# Patient Record
Sex: Female | Born: 1985 | Race: White | Hispanic: No | Marital: Married | Smoking: Never smoker
Health system: Southern US, Community
[De-identification: ages and names within clinical notes are randomized; demographics above are authoritative.]

## PROBLEM LIST (undated history)

## (undated) DIAGNOSIS — D649 Anemia, unspecified: Secondary | ICD-10-CM

## (undated) DIAGNOSIS — E785 Hyperlipidemia, unspecified: Secondary | ICD-10-CM

## (undated) DIAGNOSIS — T7840XA Allergy, unspecified, initial encounter: Secondary | ICD-10-CM

## (undated) DIAGNOSIS — K603 Anal fistula, unspecified: Secondary | ICD-10-CM

## (undated) HISTORY — PX: NO PAST SURGERIES: SHX2092

## (undated) HISTORY — DX: Hyperlipidemia, unspecified: E78.5

## (undated) HISTORY — DX: Anal fistula, unspecified: K60.30

## (undated) HISTORY — DX: Allergy, unspecified, initial encounter: T78.40XA

---
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
## 2005-08-04 ENCOUNTER — Other Ambulatory Visit: Admission: RE | Admit: 2005-08-04 | Discharge: 2005-08-04 | Payer: Self-pay | Admitting: Family Medicine

## 2006-12-03 ENCOUNTER — Emergency Department: Payer: Self-pay | Admitting: Emergency Medicine

## 2008-03-03 ENCOUNTER — Inpatient Hospital Stay (HOSPITAL_COMMUNITY): Admission: AD | Admit: 2008-03-03 | Discharge: 2008-03-05 | Payer: Self-pay | Admitting: Obstetrics and Gynecology

## 2010-05-07 ENCOUNTER — Inpatient Hospital Stay (HOSPITAL_COMMUNITY): Admission: AD | Admit: 2010-05-07 | Payer: Self-pay | Admitting: Obstetrics and Gynecology

## 2010-07-11 ENCOUNTER — Ambulatory Visit: Payer: Self-pay | Admitting: Dietician

## 2010-08-10 ENCOUNTER — Inpatient Hospital Stay (HOSPITAL_COMMUNITY)
Admission: AD | Admit: 2010-08-10 | Discharge: 2010-08-12 | DRG: 775 | Disposition: A | Discharge status: Home / Self Care | Payer: 59 | Source: Ambulatory Visit | Attending: Obstetrics and Gynecology | Admitting: Obstetrics and Gynecology

## 2010-08-10 DIAGNOSIS — O99892 Other specified diseases and conditions complicating childbirth: Secondary | ICD-10-CM | POA: Diagnosis present

## 2010-08-10 DIAGNOSIS — D649 Anemia, unspecified: Secondary | ICD-10-CM | POA: Diagnosis not present

## 2010-08-10 DIAGNOSIS — O9903 Anemia complicating the puerperium: Secondary | ICD-10-CM | POA: Diagnosis not present

## 2010-08-10 DIAGNOSIS — Z22330 Carrier of Group B streptococcus: Secondary | ICD-10-CM

## 2010-08-11 LAB — CBC
HCT: 32.8 % — ABNORMAL LOW (ref 36.0–46.0)
Hemoglobin: 10.9 g/dL — ABNORMAL LOW (ref 12.0–15.0)
MCH: 28.8 pg (ref 26.0–34.0)
MCHC: 33.2 g/dL (ref 30.0–36.0)
MCV: 86.8 fL (ref 78.0–100.0)
Platelets: 152 10*3/uL (ref 150–400)
RDW: 12.7 % (ref 11.5–15.5)
WBC: 14.8 10*3/uL — ABNORMAL HIGH (ref 4.0–10.5)

## 2010-08-11 LAB — RPR: RPR Ser Ql: NONREACTIVE

## 2010-08-12 LAB — CBC
HCT: 30.1 % — ABNORMAL LOW (ref 36.0–46.0)
Hemoglobin: 9.9 g/dL — ABNORMAL LOW (ref 12.0–15.0)
MCH: 28.7 pg (ref 26.0–34.0)
MCHC: 32.9 g/dL (ref 30.0–36.0)
MCV: 87.2 fL (ref 78.0–100.0)
Platelets: 170 K/uL (ref 150–400)
RBC: 3.45 MIL/uL — ABNORMAL LOW (ref 3.87–5.11)
RDW: 12.7 % (ref 11.5–15.5)
WBC: 14.3 K/uL — ABNORMAL HIGH (ref 4.0–10.5)

## 2010-08-19 NOTE — H&P (Signed)
NAMEVELENCIA, LENART NO.:  [id number]   MEDICAL RECORD NO.:  [id number]          PATIENT TYPE:  MAT   LOCATION:  MATC                          FACILITY:  [facility]   PHYSICIAN:  Crist Fat. Rivard, M.D. DATE OF BIRTH:  1986/02/24   DATE OF ADMISSION:  03/03/2008  DATE OF DISCHARGE:                              HISTORY & PHYSICAL   Ms. Ertel is a 25 year old married white female gravida 2, para 1-0-0-1  at 50 weeks' gestation per an [facility] of March 10, 2008 who presents in  early labor.  She reports irregular contractions since waking this  morning which are now 1-3 minutes since around 12:30 this afternoon.  She reports good fetal movement.  No vaginal bleeding or leakage of  fluid, no PIH or UTI signs or symptoms, fever, cough or shortness of  breath.  She has been followed by the CNM service at [facility].  History  remarkable for:  1. Questionable LMP and had a 9-4/7 weeks' ultrasound that was      consistent with her LMP date.  2. Group beta strep positive.   OBSTETRICAL HISTORY:  Gravida 1 spontaneous vaginal delivery of a female  in March 2005 at 40 weeks' gestation weighing 7 pounds 14 ounces after  21 hours of labor.  She did have an epidural.  No complications.  Delivered by Larene Beach and his name is Aidan.  Gravida 2 is current  pregnancy.   PRENATAL LABS:  Blood type is A+, Rh antibody screen negative, RPR  nonreactive, rubella titer immune, hepatitis surface antigen negative,  HIV nonreactive.  Cystic fibrosis negative.  Hemoglobin Aug 10, 2007 was  12.7, hematocrit 37.5 and platelets were 236.   PAST MEDICAL HISTORY:   ALLERGIES:  SHE DENIES MEDICATION OR LATEX ALLERGIES.  SHE DOES REPORT  SEASONAL ALLERGIES.   MENSTRUAL HISTORY:  She reports menarche at age 85, monthly cycles.  No  abnormalities.  Did not have a normal last menstrual period but first  day was June 04, 2007, which gave her an [facility] of March 10, 2008 and  like I noted, she did have an  ultrasound for dating and it was  consistent within 3 days of her EDC by the LMP.  She has used oral  contraceptive pills in the past for birth control.  She discontinued her  Yasmin in January of 2009.  She was GBS positive with her first child as  well as this pregnancy.  She did have varicella as a child.  Denies any  abnormal Pap smears or history of STDs.  The patient did not have any  other significant medical problems.  She did have her wisdom teeth  extracted in 2006 and the anesthesia did make her loopy.  Genetic  history remarkable for a maternal aunt with cerebral palsy.   FAMILY HISTORY:  Remarkable for maternal grandmother with chronic  hypertension and on medication.  Paternal uncle and wife, drugs and  alcohol.   SOCIAL HISTORY:  The patient is a married white female.  The name of the  baby's father is Alya Smaltz.  He is involved and supportive.  The  patient works full time for [employer].  Has a high Education officer, community.  The  father of the baby works full time as a Facilities manager, has a bachelor's  degree.  She denied alcohol, tobacco or illicit drug use.   HISTORY OF PRESENT PREGNANCY:  She entered care, I think, July 07, 2007  for her new OB interview.  She then returned Aug 10, 2007 for her new OB  workup.  Her height is 5 feet 5-1/2 inches.  Her pre gravid weight was  140.  She was 140 at that first visit and was 9-4/7 weeks.  Had a crown-  rump length that was measuring 9-1/7 weeks.  Planned CNM care.  She had  a Pap done in May of 2008.  She had first trimester screen done Aug 25, 2007.  Had a left corpus luteal cyst measuring 6 cm and first trimester  screen was within normal limits.  She has had a Pap smear done October 10, 2007.  Had ultrasound at 18-2/7 weeks.  Growth was consistent with her  previous dating.  Cervical length 4.21 cm, posterior placenta.  Adjusted  risk was 1 in 34,770.  She declined AFP that day.  At around 22 weeks  she was complaining of cold, was  taking cough syrup.  History of  allergies.  Drainage was clear.  No fever.  Over-the-counter comfort  measures were discussed.  She had her 1-hour GTT around 26 weeks.  It  was high, equal to 149, and then did have a subsequent 3-hour GTT.  She  got [facility] at [facility] in September.  Hemoglobin was 12 at that  time.  The patient's 3-hour GTT results were all within normal limits.  Due to receive H1N1 vaccine January 31, 2008.  GC, chlamydia and GBS  cultures were done February 17, 2008 and were negative, positive GBS,  negative GC and chlamydia cultures.  The patient's weight on February 21, 2008 was  180-1/2.  She has been measuring size equal to dates with  fundal height, vertex presentation and without other common pregnancy  discomforts.  The patient's pregnancy continued to progress without any  significant complications until her presentation today in early labor.   OBJECTIVE:  VITAL SIGNS:  On admission 130/78 blood pressure, heart rate  was slightly tachycardiac at 123.  Her temperature was 98.4 and her  respirations were 18.  Fetal heart rate baseline of 135, reactive, no  decels, moderate variability noted.  Toco showing uterine contractions  every 1-3 minutes, moderate on palpation.   PHYSICAL EXAM:  GENERAL:  No acute distress, alert and oriented x3, and  pleasant.  HEENT:  Grossly intact, within normal limits.  CARDIOVASCULAR:  Regular rate and rhythm without murmur.  LUNGS:  Clear to auscultation bilaterally.  ABDOMEN:  Soft, nontender and gravid.  PELVIC EXAM:  The patient 4+ centimeters, 70% effaced, -2, bulging bag  of water.  She did have some leakage of some fluid.  It was negative  Nitrazine and negative fern.  EXTREMITIES:  With mild generalized edema, no clonus.  DTRs 2+.   IMPRESSION:  1. Intrauterine pregnancy at 39 weeks.  2. Early labor.  3. Group beta strep positive.   PLAN:  1. Admit to birthing suites with Dr. Estanislado Pandy as the attending       physician.  2. Routine L and D orders.  3. Penicillin G IV per GBS protocol.  4. Patient undecided  regarding epidural at present.  5. Plan AROM after first dose of penicillin.  6. Consult with MD p.r.n.      Candice [place], [place]      ______________________________  Crist Fat Rivard, M.D.    CHS/MEDQ  D:  03/03/2008  T:  03/03/2008  Job:  [id number]

## 2010-08-22 NOTE — H&P (Signed)
[name], [name] NO.:  [id number]   MEDICAL RECORD NO.:  [id number]                   PATIENT TYPE:  INP   LOCATION:  9163                                 [facility]ACILITY:  [facility]   PHYSICIAN:  Crist Fat. Rivard, M.D.              DATE OF BIRTH:  May 25, 1985   DATE OF ADMISSION:  06/09/2003  DATE OF DISCHARGE:                                HISTORY & PHYSICAL   HISTORY OF PRESENT ILLNESS:  This is a 25 year old gravida 1 para 0 at 80  and one-seventh weeks who presents with complaints of regular uterine  contractions which are becoming more painful.  She denies leaking or  bleeding and reports positive fetal movement.  Pregnancy has been followed  by the nurse midwife service and remarkable for:  1. Teen.  2. Group B strep positive.   OBSTETRICAL HISTORY:  The patient is a primigravida.   MEDICAL HISTORY:  Remarkable for childhood varicella and high cholesterol.   FAMILY HISTORY:  Remarkable for varicosities in her grandmother and her  grandmother also has unknown type of thyroid dysfunction and kidney stones.  First cousin has kidney stones.   GENETIC HISTORY:  Remarkable for maternal aunt with cerebral palsy and the  father of the baby is adopted.   SOCIAL HISTORY:  The patient is single but involved with Monique [name].  She  is engaged to him.  She is of the [employer].  She is currently a  Consulting civil engineer.  She denies any alcohol, tobacco, or drug use.   PRENATAL LABORATORY DATA:  Hemoglobin 12.5, platelets 219.  Blood type A  positive, antibody screen negative.  RPR nonreactive.  Rubella immune.  Hepatitis negative.  HIV negative.  Pap test normal.  Gonorrhea and  chlamydia not available.  Cystic fibrosis negative.  Quad screen normal.  Group B strep positive.   OBJECTIVE DATA:  VITAL SIGNS:  Stable, afebrile.  HEENT:  Within normal limits.  Thyroid not enlarged.  CHEST:  Clear to auscultation.  HEART:  Regular rate and rhythm.  ABDOMEN:  Gravid at  38 cm, vertex to Leopold's.  EFM shows a reactive fetal  heart rate with uterine contractions every 2-3 minutes.  PELVIC:  Cervical exam initially 2 cm, 85%, -1 to -2 station, and vertex.  Then, 1 hour later she was 3 cm dilated.  EXTREMITIES:  Within normal limits.   ASSESSMENT:  1. Intrauterine pregnancy at 39 and one-seventh weeks.  2. Early labor.  3. Group B strep positive.   PLAN:  1. Admit to birthing suites.  2. Routine C.N.M. orders.  3. Group B strep prophylaxis when active.     Monique [name], C.N.M.                 Crist Fat Rivard, M.D.    MLW/MEDQ  D:  06/09/2003  T:  06/09/2003  Job:  [id number]

## 2011-01-06 LAB — CBC
HCT: 31.6 % — ABNORMAL LOW (ref 36.0–46.0)
HCT: 34.6 % — ABNORMAL LOW (ref 36.0–46.0)
Hemoglobin: 10.9 g/dL — ABNORMAL LOW (ref 12.0–15.0)
Hemoglobin: 11.9 g/dL — ABNORMAL LOW (ref 12.0–15.0)
MCHC: 34.4 g/dL (ref 30.0–36.0)
MCHC: 34.4 g/dL (ref 30.0–36.0)
MCV: 90.2 fL (ref 78.0–100.0)
MCV: 90.4 fL (ref 78.0–100.0)
Platelets: 142 10*3/uL — ABNORMAL LOW (ref 150–400)
Platelets: 170 10*3/uL (ref 150–400)
RBC: 3.49 MIL/uL — ABNORMAL LOW (ref 3.87–5.11)
RBC: 3.83 MIL/uL — ABNORMAL LOW (ref 3.87–5.11)
RDW: 13.1 % (ref 11.5–15.5)
RDW: 13.4 % (ref 11.5–15.5)
WBC: 14.9 10*3/uL — ABNORMAL HIGH (ref 4.0–10.5)
WBC: 18.1 10*3/uL — ABNORMAL HIGH (ref 4.0–10.5)

## 2011-01-06 LAB — RPR: RPR Ser Ql: NONREACTIVE

## 2011-10-07 ENCOUNTER — Other Ambulatory Visit: Payer: Self-pay | Admitting: Obstetrics and Gynecology

## 2011-10-07 MED ORDER — NORETHINDRONE 0.35 MG PO TABS: 1.0000 | ORAL_TABLET | Freq: Every day | ORAL | Status: DC

## 2011-10-07 NOTE — Telephone Encounter (Signed)
Pt called, states would like Camila called in, was doing mail order, but her insurance ended and is now on her husband's insurance.  Pt is on last week or current pack, had AEX on January 2013.  Pt's rx for Camila E-Prescribed to pharmacy of choice w/ RF's through until next AEX.

## 2011-12-08 ENCOUNTER — Telehealth: Payer: Self-pay | Admitting: Obstetrics and Gynecology

## 2011-12-08 NOTE — Telephone Encounter (Signed)
Pt c/o having menstrual bleeding every 2 weeks since her birth control pill changed from camilla to a different generic. Pt states she changes a pad about 4 times per day and experiences some cramping but no fever or any other symptoms. Pt was previously scheduled for 12/11/11 and decided to keep this appointment since this is the first available. Advised pt to call the office back if she begins to feel any pain or has a fever. Also advised the pt to call in the morning to see if there are any cancellations in order for the pt to be worked in to the schedule earlier. Pt voiced understanding.

## 2011-12-08 NOTE — Telephone Encounter (Signed)
Triage/appt req. °

## 2011-12-11 ENCOUNTER — Ambulatory Visit (INDEPENDENT_AMBULATORY_CARE_PROVIDER_SITE_OTHER): Payer: BC Managed Care – PPO

## 2011-12-11 VITALS — BP 110/66 | Wt 143.0 lb

## 2011-12-11 DIAGNOSIS — N938 Other specified abnormal uterine and vaginal bleeding: Secondary | ICD-10-CM

## 2011-12-11 DIAGNOSIS — N949 Unspecified condition associated with female genital organs and menstrual cycle: Secondary | ICD-10-CM

## 2011-12-11 NOTE — Progress Notes (Signed)
When did bleeding start: 11/26/11 How  Long: still bleeding How often changing pad/tampon: changes pad 4 times per day Bleeding Disorders: no Cramping: yes Contraception: yes pill Fibroids: no Hormone Therapy: no New Medications: no Menopausal Symptoms: no Vag. Discharge: no Abdominal Pain: no Increased Stress: no

## 2011-12-16 ENCOUNTER — Other Ambulatory Visit: Payer: BC Managed Care – PPO

## 2011-12-16 DIAGNOSIS — N938 Other specified abnormal uterine and vaginal bleeding: Secondary | ICD-10-CM

## 2011-12-16 LAB — CBC
HCT: 38.9 % (ref 36.0–46.0)
Hemoglobin: 13.2 g/dL (ref 12.0–15.0)
MCH: 29.1 pg (ref 26.0–34.0)
MCV: 85.7 fL (ref 78.0–100.0)
Platelets: 253 10*3/uL (ref 150–400)
RBC: 4.54 MIL/uL (ref 3.87–5.11)
RDW: 13.2 % (ref 11.5–15.5)
WBC: 7.5 10*3/uL (ref 4.0–10.5)

## 2011-12-16 LAB — TSH: TSH: 1.131 u[IU]/mL (ref 0.350–4.500)

## 2011-12-30 ENCOUNTER — Ambulatory Visit (INDEPENDENT_AMBULATORY_CARE_PROVIDER_SITE_OTHER): Payer: BC Managed Care – PPO | Admitting: Obstetrics and Gynecology

## 2011-12-30 ENCOUNTER — Encounter: Payer: Self-pay | Admitting: Obstetrics and Gynecology

## 2011-12-30 ENCOUNTER — Other Ambulatory Visit: Payer: Self-pay

## 2011-12-30 ENCOUNTER — Ambulatory Visit: Payer: BC Managed Care – PPO

## 2011-12-30 VITALS — BP 114/70 | Temp 97.9°F | Wt 143.0 lb

## 2011-12-30 DIAGNOSIS — N938 Other specified abnormal uterine and vaginal bleeding: Secondary | ICD-10-CM

## 2011-12-30 DIAGNOSIS — N949 Unspecified condition associated with female genital organs and menstrual cycle: Secondary | ICD-10-CM

## 2011-12-30 NOTE — Progress Notes (Signed)
26 YO Lactating female on Moran (now with good bleeding control compared to the Nor-B she was on) presents for ultrasound.  O: U/S-uterus-7.46 x 5.85 x 3.75 cm, endometrium-0.225 cm, normal appearing ovaries and Gartner duct cyst 5.1 x 3.0 x 4.8 cm  A: Irregular Bleeding due to generic pills     Breastfeeding  P:  RTO-as scheduled or prn       Will want to resume combination BCPs once she stops breastfeeding  Tamir Wallman, PA-C

## 2012-02-05 ENCOUNTER — Telehealth: Payer: Self-pay

## 2012-02-05 NOTE — Telephone Encounter (Signed)
VM from pt. Pt is no longer breastfeeding & wants a different BC.

## 2012-02-08 MED ORDER — NORETHIN ACE-ETH ESTRAD-FE 1-20 MG-MCG(24) PO CHEW: 1.0000 mg | CHEWABLE_TABLET | Freq: Every day | ORAL | Status: DC

## 2012-02-08 NOTE — Addendum Note (Signed)
Addended by: Janeece Agee on: 02/08/2012 09:52 AM   Modules accepted: Orders

## 2012-02-08 NOTE — Telephone Encounter (Signed)
Spoke with pt requesting a different BC since she no longer breastfeeding. Pt states her sisters are happy with Loestrin 24 & she wants to try it. Informed pt Loestrin 24 has been changed to Minastrin 24. Consulted with MK and it was approved. Pt is aware and comprehends.

## 2012-02-09 ENCOUNTER — Telehealth: Payer: Self-pay | Admitting: Obstetrics and Gynecology

## 2012-02-09 NOTE — Telephone Encounter (Signed)
Medication change 

## 2012-02-10 MED ORDER — NORGESTIMATE-ETH ESTRADIOL 0.25-35 MG-MCG PO TABS: 1.0000 | ORAL_TABLET | Freq: Every day | ORAL | Status: DC

## 2012-02-10 NOTE — Telephone Encounter (Signed)
responding to your question

## 2012-02-10 NOTE — Telephone Encounter (Signed)
Tc to pt per telephone call. Pt states,"ins will only pay for generic rx's only". Pt recently given Minastrin 24 due to d/c of Micronor(no longer breastfeeding). Minastrin 24 not covered. Will consult with EP per recs. Pt would like a pill that may help with irreg bldg and acne.

## 2012-02-10 NOTE — Telephone Encounter (Signed)
Patient on Loestrin 24 (Minastrin 24) but cost is now prohibitive.  Patient may be given Sprintec (generic for Orrho-Cyclen)  #1   1 po qd with refills x 5.  This pill should help with acne and manage bleeding issues.  Marry Kusch, PA-C

## 2012-02-10 NOTE — Telephone Encounter (Signed)
Triage to address 

## 2012-02-10 NOTE — Telephone Encounter (Signed)
Tc to pt per EP recs. Rx for Sprintec on file e-pres to pharm on file. Pt agrees.

## 2012-08-30 NOTE — Progress Notes (Signed)
..   Subjective:   Monique [name] is a 27y.o. MWF who presents w/ CC of irregular VB for 12 days, which she attributes to [facility] pills being generic instead of Brand.  Pap negative 05/05/11.  No h/o stds and in monogamous relationship. When did bleeding start: 11/26/11  How Long: still bleeding  How often changing pad/tampon: changes pad 4 times per day  Bleeding Disorders: no  Cramping: yes  Contraception: yes pill  Fibroids: no  Hormone Therapy: no  New Medications: no  Menopausal Symptoms: no  Vag. Discharge: no  Abdominal Pain: no  Increased Stress: no  Menstrual History: OB History   Grav Para Term Preterm Abortions TAB SAB Ect Mult Living   3 3        3        Patient's last menstrual period was 11/26/2011.    Review of Systems Pertinent items are noted in HPI.    Objective:    BP 110/66  Wt 143 lb (64.864 kg)  LMP 11/26/2011  General:   alert, cooperative, appears stated age and no distress  Skin:    normal, no cyanosis, jaundice, pallor or bruising  Neck:  supple and no masses Heart: RRR     Lungs: clear  Abdomen:  soft, non-tender; bowel sounds normal; no masses,  no organomegaly  Pelvic:   cervix normal in appearance, external genitalia normal, no adnexal masses or tenderness, no cervical motion tenderness, rectovaginal septum normal, uterus normal size, shape, and consistency and vagina normal without discharge          Assessment:    Dysfunctional uterine bleeding    Plan:    CBC and TSH today.  Pelvic u/s ASAP.  Switch to brand POP.  Bleeding precautions rev'd.  Motrin po prn.  f/u next available u/s appt, or prn.   C. Denny Levy, CNM

## 2014-02-05 ENCOUNTER — Encounter: Payer: Self-pay | Admitting: Obstetrics and Gynecology

## 2016-10-27 DIAGNOSIS — K3184 Gastroparesis: Secondary | ICD-10-CM | POA: Insufficient documentation

## 2016-10-27 DIAGNOSIS — K30 Functional dyspepsia: Secondary | ICD-10-CM | POA: Insufficient documentation

## 2016-10-28 DIAGNOSIS — R87619 Unspecified abnormal cytological findings in specimens from cervix uteri: Secondary | ICD-10-CM | POA: Insufficient documentation

## 2016-10-28 DIAGNOSIS — E7800 Pure hypercholesterolemia, unspecified: Secondary | ICD-10-CM | POA: Insufficient documentation

## 2017-02-10 ENCOUNTER — Ambulatory Visit: Payer: Self-pay | Admitting: Adult Health

## 2017-02-10 VITALS — BP 120/70 | HR 73 | Temp 98.6°F | Resp 16 | Wt 147.0 lb

## 2017-02-10 DIAGNOSIS — R5383 Other fatigue: Secondary | ICD-10-CM | POA: Insufficient documentation

## 2017-02-10 DIAGNOSIS — K644 Residual hemorrhoidal skin tags: Secondary | ICD-10-CM | POA: Insufficient documentation

## 2017-02-10 DIAGNOSIS — R1032 Left lower quadrant pain: Secondary | ICD-10-CM

## 2017-02-10 DIAGNOSIS — R1030 Lower abdominal pain, unspecified: Secondary | ICD-10-CM

## 2017-02-10 LAB — POCT URINALYSIS DIPSTICK
Bilirubin, UA: NEGATIVE
Blood, UA: NEGATIVE
Glucose, UA: NEGATIVE
Ketones, UA: NEGATIVE
Leukocytes, UA: NEGATIVE
Nitrite, UA: NEGATIVE
Protein, UA: NEGATIVE
Spec Grav, UA: 1.01 (ref 1.010–1.025)
Urobilinogen, UA: 1 E.U./dL
pH, UA: 6 (ref 5.0–8.0)

## 2017-02-10 LAB — POCT URINE PREGNANCY: Preg Test, Ur: NEGATIVE

## 2017-02-10 MED ORDER — ONDANSETRON 4 MG PO TBDP: 4.0000 mg | ORAL_TABLET | Freq: Three times a day (TID) | ORAL | 0 refills | Status: DC | PRN

## 2017-02-10 NOTE — Patient Instructions (Addendum)
Schedule Gastrointestinal MD appointment with provider you were referred to by GYNECOLOGY as soon as possible.  Go to Emergency room if symptoms worsen or change at anytime. Call 911 for any emergency symptom.  If continued left lower quadrant pain will need to follow up with Gynecology as soon as possible for a pelvic exam as no pelvic exams are done in this office.   Abdominal/ pelvic ultrasound at 8 am at [facility] - be there at 7:45am.  Nothing to eat or drink after midnight tonight.  Please schedule an appointment with a primary care provider for full physical as well as this practice in not accepting new patients for primary care at this time.    Ondansetron tablets What is this medicine? ONDANSETRON (on DAN se tron) is used to treat nausea and vomiting caused by chemotherapy. It is also used to prevent or treat nausea and vomiting after surgery. This medicine may be used for other purposes; ask your health care provider or pharmacist if you have questions. COMMON BRAND NAME(S): Zofran What should I tell my health care provider before I take this medicine? They need to know if you have any of these conditions: -heart disease -history of irregular heartbeat -liver disease -low levels of magnesium or potassium in the blood -an unusual or allergic reaction to ondansetron, granisetron, other medicines, foods, dyes, or preservatives -pregnant or trying to get pregnant -breast-feeding How should I use this medicine? Take this medicine by mouth with a glass of water. Follow the directions on your prescription label. Take your doses at regular intervals. Do not take your medicine more often than directed. Talk to your pediatrician regarding the use of this medicine in children. Special care may be needed. Overdosage: If you think you have taken too much of this medicine contact a poison control center or emergency room at once. NOTE: This medicine is only for you. Do not share this  medicine with others. What if I miss a dose? If you miss a dose, take it as soon as you can. If it is almost time for your next dose, take only that dose. Do not take double or extra doses. What may interact with this medicine? Do not take this medicine with any of the following medications: -apomorphine -certain medicines for fungal infections like fluconazole, itraconazole, ketoconazole, posaconazole, voriconazole -cisapride -dofetilide -dronedarone -pimozide -thioridazine -ziprasidone This medicine may also interact with the following medications: -carbamazepine -certain medicines for depression, anxiety, or psychotic disturbances -fentanyl -linezolid -MAOIs like Carbex, Eldepryl, Marplan, Nardil, and Parnate -methylene blue (injected into a vein) -other medicines that prolong the QT interval (cause an abnormal heart rhythm) -phenytoin -rifampicin -tramadol This list may not describe all possible interactions. Give your health care provider a list of all the medicines, herbs, non-prescription drugs, or dietary supplements you use. Also tell them if you smoke, drink alcohol, or use illegal drugs. Some items may interact with your medicine. What should I watch for while using this medicine? Check with your doctor or health care professional right away if you have any sign of an allergic reaction. What side effects may I notice from receiving this medicine? Side effects that you should report to your doctor or health care professional as soon as possible: -allergic reactions like skin rash, itching or hives, swelling of the face, lips or tongue -breathing problems -confusion -dizziness -fast or irregular heartbeat -feeling faint or lightheaded, falls -fever and chills -loss of balance or coordination -seizures -sweating -swelling of the hands or feet -  tightness in the chest -tremors -unusually weak or tired Side effects that usually do not require medical attention (report  to your doctor or health care professional if they continue or are bothersome): -constipation or diarrhea -headache This list may not describe all possible side effects. Call your doctor for medical advice about side effects. You may report side effects to FDA at 1-800-FDA-1088. Where should I keep my medicine? Keep out of the reach of children. Store between 2 and 30 degrees C (36 and 86 degrees F). Throw away any unused medicine after the expiration date. NOTE: This sheet is a summary. It may not cover all possible information. If you have questions about this medicine, talk to your doctor, pharmacist, or health care provider.  2018 Elsevier/Gold Standard (2012-12-28 16:27:45) Abdominal Pain, Adult Many things can cause belly (abdominal) pain. Most times, belly pain is not dangerous. Many cases of belly pain can be watched and treated at home. Sometimes belly pain is serious, though. Your doctor will try to find the cause of your belly pain. Follow these instructions at home:  Take over-the-counter and prescription medicines only as told by your doctor. Do not take medicines that help you poop (laxatives) unless told to by your doctor.  Drink enough fluid to keep your pee (urine) clear or pale yellow.  Watch your belly pain for any changes.  Keep all follow-up visits as told by your doctor. This is important. Contact a doctor if:  Your belly pain changes or gets worse.  You are not hungry, or you lose weight without trying.  You are having trouble pooping (constipated) or have watery poop (diarrhea) for more than 2-3 days.  You have pain when you pee or poop.  Your belly pain wakes you up at night.  Your pain gets worse with meals, after eating, or with certain foods.  You are throwing up and cannot keep anything down.  You have a fever. Get help right away if:  Your pain does not go away as soon as your doctor says it should.  You cannot stop throwing up.  Your pain is  only in areas of your belly, such as the right side or the left lower part of the belly.  You have bloody or black poop, or poop that looks like tar.  You have very bad pain, cramping, or bloating in your belly.  You have signs of not having enough fluid or water in your body (dehydration), such as: ? Dark pee, very little pee, or no pee. ? Cracked lips. ? Dry mouth. ? Sunken eyes. ? Sleepiness. ? Weakness. This information is not intended to replace advice given to you by your health care provider. Make sure you discuss any questions you have with your health care provider. Document Released: 09/09/2007 Document Revised: 10/11/2015 Document Reviewed: 09/04/2015 Elsevier Interactive Patient Education  2017 Elsevier Inc. Pelvic Exam A pelvic exam is an exam of a woman's outer and inner genitals and reproductive organs. Pelvic exams are done to screen for health problems and to help prevent health problems from developing. You should start having pelvic exams when you turn 31 years old, unless your health care provider recommends having a pelvic exam earlier. Talk with your health care provider about how often you should have a pelvic exam. During your pelvic exam, your health care provider may ask you questions about your health, your family's health, your menstrual periods, immunizations, and your sexual activity. The information shared between you and your health care  provider will not be shared with anyone else. What are some reasons to have a pelvic exam? There are many possible reasons for having a pelvic exam. A pelvic exam may be recommended to check for:  Normal development and function of the reproductive organs.  Cancer of the ovaries, uterus, or vagina.  Signs of sexually transmitted infections (STIs) or other types of infections.  Pregnancy. If you are pregnant, a pelvic exam can also help determine how far along you are in your pregnancy.  Widening (dilation) of the cervix  during labor.  Injury (trauma) to the reproductive organs.  A pelvic exam may be recommended to help explain or diagnose:  Changes in your body that may be signs of cancer in the reproductive system.  Inability to get pregnant (infertility).  Vaginal itching or burning.  Abnormal vaginal discharge or bleeding.  Problems with sexual function.  Problems with urination, such as: ? Painful urination. ? Frequent urinary tract infections. ? Inability to control when you urinate (urinary incontinence).  Problems with menstrual periods, such as: ? Severe cramping. ? Absence of any menstrual flow in a female by the age of 15 years (primary amenorrhea). ? Stopping of menstrual flow for 3-6 months at a time (secondary amenorrhea).  Depending on the purpose of your pelvic exam, your health care provider may perform:  A Pap test. This is sometimes called a Pap smear. It is a screening test that is used to check for signs of cancer of the vagina, cervix, and uterus. The test can also identify the presence of infection or precancerous changes.  A cervical biopsy. This is the removal of a small sample of tissue from the cervix. The cervix is the lowest part of the womb (uterus), which opens into the vagina (birth canal). The tissue will be checked under a microscope.  Other diagnostic tests that involve taking samples of tissue or fluid (cultures).  If you have tests done, it is your responsibility to get your test results. Ask your health care provider or the department performing the test when your results will be ready. How is a pelvic exam performed? Usually, a physical exam is done first. This may include:  An exam of your breasts. Your health care provider may feel your breasts to check for abnormalities.  An exam of your abdomen. Your health care provider may press on your abdomen to check for abnormalities.  Pelvic exams may vary among health care providers and hospitals. The  following things are usually done during a pelvic exam:  You will remove your clothes from the waist down. You will put on a gown or a wrap to cover yourself while you get ready for the exam.  You will lie on your back on a special table. Your feet will be placed into foot rests (stirrups) so that your legs are wide apart and your knees are bent. A drape will be placed over your abdomen and your legs.  Your health care provider will examine your outer genitals to check for anything unusual. This includes your clitoris, urethra, vaginal opening, labia, and the skin between your vagina and your anus (perineum).  Your health care provider will examine your inner genitals. To do this, a lubricated instrument (speculum) will be inserted into your vagina. The speculum will be widened to open the walls of your vagina. ? Your health care provider will examine your vagina and cervix. ? A Pap test, cervical biopsy, or cultures may be done as needed. ? After  the internal exam is done, the speculum will be removed.  Your health care provider will put on germ-free (sterile) latex gloves and insert two fingers into your vagina to gently press against various organs. ? Your health care provider may use his or her other hand to gently press on your lower abdomen while doing this.  A pelvic exam is usually painless, although it can cause mild discomfort. If you experience pain at any time during your pelvic exam, tell your health care provider right away. When should I seek medical care? Seek medical care after your pelvic exam if:  You develop new symptoms.  You experience pain or discomfort from anything that was done during your pelvic exam.  This information is not intended to replace advice given to you by your health care provider. Make sure you discuss any questions you have with your health care provider. Document Released: 06/13/2002 Document Revised: 08/07/2015 Document Reviewed:  12/25/2014 Elsevier Interactive Patient Education  2018 [employer]

## 2017-02-10 NOTE — Progress Notes (Signed)
Subjective:     Patient ID: Monique [name], female   DOB: 01-Jul-1985, 31 y.o.   MRN: [id number]  HPI   Patient is a 31 year old female in no acute distress who presents to the clinic with left lower quadrant abdominal pain that started 02/09/17 in the am.  She reports pain 3/ 10 on pain scale constantly and at times increases to 7/10. Currently 3/10 pain feels like "cramping": She also reports significant pressure in her lower abdomen. She reports she works out frequently and was able to work out intensely yesterday and able to carry out normal activities today.   She denies any radiation of pain or any paresthesias.    LMP 01/20/17 last period and cycle is regularly.  She does not have a PCP per her report.  GYN visits with [facility] midwife- last pap smear - she reports this was atypical and she was advised recheck in one year.  She reports GYN has referred her to Gastrointestinal  MD for decreased GI motility. She reports yesterday she had 3 to 4 BM's - no diarrhea. She usually has one bowel movement daily. She has had nausea since yesterday. She has eaten but appetite decreased.   G3P#- youngest 31 years old  History of ovarian cysts.   She has had toast/ strawberries and coffee and water today.   Denies any ill contacts or exposures. She denies any injury or trauma.  Patient Active Problem List   Diagnosis Date Noted  . External hemorrhoids 02/10/2017  . Fatigue 02/10/2017  . Abnormal Pap smear of cervix 10/28/2016  . Hypercholesterolemia 10/28/2016  . 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 rate 16, weight 147 lb (66.7 kg), last menstrual period 01/20/2017, SpO2 98 %.    Review of Systems  Constitutional: Negative for activity change, appetite change, chills, diaphoresis, fatigue, fever and unexpected weight change.       Continues to work out three to five times per  week during this time per her report without difficulty.   HENT: Negative.   Eyes: Negative.   Respiratory: Negative.   Cardiovascular: Negative.   Gastrointestinal: Positive for abdominal distention (mild started yesterday/ pelvis. ), abdominal pain (x 2 days left lower abdomen ) and nausea. Negative for anal bleeding, blood in stool, constipation, diarrhea, rectal pain and vomiting.  Endocrine: Negative.   Genitourinary: Negative for decreased urine volume, difficulty urinating, dyspareunia, dysuria, enuresis, flank pain, frequency, genital sores, hematuria, menstrual problem, pelvic pain, urgency, vaginal bleeding, vaginal discharge and vaginal pain.  Musculoskeletal: Negative.   Skin: Negative.   Allergic/Immunologic: Negative.   Neurological: Negative.   Hematological: Negative.   Psychiatric/Behavioral: Negative.        Objective:   Physical Exam  Constitutional: She is oriented to person, place, and time. She appears well-developed and well-nourished. No distress.  Pleasant smiling   HENT:  Head: Normocephalic and atraumatic.  Right Ear: External ear normal.  Left Ear: External ear normal.  Nose: Nose normal.  Mouth/Throat: Oropharynx is clear and moist. No oropharyngeal exudate.  Eyes: Conjunctivae are normal. Pupils are equal, round, and reactive to light.  Cardiovascular: Normal rate, regular rhythm, normal heart sounds and intact distal pulses.  Pulmonary/Chest: Effort normal and breath sounds normal. No respiratory distress. She has no wheezes. She has no rales. She exhibits no tenderness.  Abdominal: Soft. Normal aorta and bowel sounds are  normal. She exhibits no distension and no mass. There is generalized tenderness (left lower qyuadrant with deep light and deep palpation. ). There is no rebound and no guarding.  Genitourinary:  Genitourinary Comments:  No gynecology exams done in this office currently and no equipment available. Patient is aware she will have to see  gynecology if needed for any pelvic/vaginal exam and will follow up with gynecology/obgyn as needed. Yearly gynecology pelvic exam recommended. Patient verbalized understanding of instructions and denies any further questions at this time.   Patient has been referred to Gastrointestinal MD per patients report sand she will call and schedule as soon as possible.   Musculoskeletal: Normal range of motion.  Neurological: She is alert and oriented to person, place, and time. She has normal reflexes.  Skin: Skin is warm and dry. No rash noted. She is not diaphoretic. No erythema. No pallor.  Psychiatric: She has a normal mood and affect. Her speech is normal and behavior is normal. Judgment and thought content normal. Cognition and memory are normal.  Vitals reviewed.      Assessment:     Left lower quadrant pain - Plan: US ABDOMINAL PELVIC ART/VENT FLOW DOPPLER, CBC w/Diff, Comprehensive metabolic panel     Plan:     Schedule Gastrointestinal MD appointment with provider you were referred to by GYNECOLOGY as soon as possible.  Go to Emergency room if symptoms worsen or change at anytime. Call 911 for any emergency symptom.  If continued left lower quadrant pain will need to follow up with Gynecology for a pelvic exam as no pelvic exams are done in this office.   [facility] be there at 7:45am.  Nothing to eat or drink after midnight tonight.  Please schedule an appointment with a primary care provider for full physical as well as this practice in not accepting new patients for primary care at this time. Meds ordered this encounter  Medications  . ondansetron (ZOFRAN-ODT) 4 MG disintegrating tablet    Sig: Take 1 tablet (4 mg total) every 8 (eight) hours as needed by mouth for nausea or vomiting.    Dispense:  10 tablet    Refill:  0   Orders Placed This Encounter  Procedures  . US ABDOMINAL PELVIC ART/VENT FLOW DOPPLER    Standing Status:    Future    Standing Expiration Date:   04/12/2018    Order Specific Question:   Reason for Exam (SYMPTOM  OR DIAGNOSIS REQUIRED)    Answer:   left lower pelvic pain x 2 day    Order Specific Question:   Preferred imaging location?    Answer[facility]    Order Specific Question:   Call Results- Best Contact Number?    Answer:   [phone] / last eaten 9am today / water today.  Marland Kitchen. CBC w/Diff  . Comprehensive metabolic panel    Order Specific Question:   Has the patient fasted?    Answer:   No   Orders Placed This Encounter  Procedures  . US ABDOMINAL PELVIC ART/VENT FLOW DOPPLER    Spoke w/tracy [phone]#[phone]    Standing Status:   Future    Number of Occurrences:   1    Standing Expiration Date:   04/12/2018    Order Specific Question:   Reason for Exam (SYMPTOM  OR DIAGNOSIS REQUIRED)    Answer:   left lower pelvic pain x 2 day  Order Specific Question:   Preferred imaging location?    [facility]    Order Specific Question:   Call Results- Best Contact Number?    Answer:   [phone] / last eaten 9am today / water today.  Marland Kitchen. US Pelvis Complete    Order Specific Question:   Reason for Exam (SYMPTOM  OR DIAGNOSIS REQUIRED)    Answer:   left lower pelvic pain    Order Specific Question:   Preferred imaging location?    Answer[facility]  . CBC w/Diff  . Comprehensive metabolic panel    Order Specific Question:   Has the patient fasted?    Answer:   No  . POCT urinalysis dipstick  . POCT urine pregnancy   Advised to return to clinic for an appointment if no improvement within 72 hours or if any symptoms change or worsen. Advised ER or urgent Care if after hours or on weekend. 911 for emergency symptoms at any time.  Patient verbalized understanding of all instructions given and denies any further questions at this time.

## 2017-02-11 ENCOUNTER — Ambulatory Visit (HOSPITAL_COMMUNITY)
Admission: RE | Admit: 2017-02-11 | Discharge: 2017-02-11 | Disposition: A | Discharge status: Home / Self Care | Payer: BLUE CROSS/BLUE SHIELD | Source: Ambulatory Visit | Attending: Adult Health | Admitting: Adult Health

## 2017-02-11 ENCOUNTER — Other Ambulatory Visit: Payer: Self-pay | Admitting: Adult Health

## 2017-02-11 ENCOUNTER — Telehealth: Payer: Self-pay | Admitting: Adult Health

## 2017-02-11 DIAGNOSIS — R1030 Lower abdominal pain, unspecified: Secondary | ICD-10-CM

## 2017-02-11 DIAGNOSIS — R1032 Left lower quadrant pain: Secondary | ICD-10-CM

## 2017-02-11 LAB — COMPREHENSIVE METABOLIC PANEL
ALT: 15 IU/L (ref 0–32)
AST: 16 IU/L (ref 0–40)
Albumin/Globulin Ratio: 1.8 (ref 1.2–2.2)
Albumin: 4.7 g/dL (ref 3.5–5.5)
Alkaline Phosphatase: 49 IU/L (ref 39–117)
BUN/Creatinine Ratio: 11 (ref 9–23)
BUN: 8 mg/dL (ref 6–20)
Bilirubin Total: 0.9 mg/dL (ref 0.0–1.2)
CO2: 23 mmol/L (ref 20–29)
Calcium: 9.6 mg/dL (ref 8.7–10.2)
Chloride: 103 mmol/L (ref 96–106)
Creatinine, Ser: 0.75 mg/dL (ref 0.57–1.00)
GFR calc Af Amer: 123 mL/min/{1.73_m2} (ref >59)
GFR calc non Af Amer: 107 mL/min/{1.73_m2} (ref >59)
Globulin, Total: 2.6 g/dL (ref 1.5–4.5)
Glucose: 86 mg/dL (ref 65–99)
Potassium: 4.3 mmol/L (ref 3.5–5.2)
Sodium: 139 mmol/L (ref 134–144)
Total Protein: 7.3 g/dL (ref 6.0–8.5)

## 2017-02-11 LAB — CBC WITH DIFFERENTIAL/PLATELET
Basophils Absolute: 0 10*3/uL (ref 0.0–0.2)
Basos: 0 %
EOS (ABSOLUTE): 0.2 10*3/uL (ref 0.0–0.4)
Eos: 2 %
Hematocrit: 39.1 % (ref 34.0–46.6)
Hemoglobin: 13.6 g/dL (ref 11.1–15.9)
Immature Grans (Abs): 0 10*3/uL (ref 0.0–0.1)
Immature Granulocytes: 0 %
Lymphocytes Absolute: 2.4 10*3/uL (ref 0.7–3.1)
Lymphs: 35 %
MCH: 30.2 pg (ref 26.6–33.0)
MCHC: 34.8 g/dL (ref 31.5–35.7)
MCV: 87 fL (ref 79–97)
Monocytes Absolute: 0.6 10*3/uL (ref 0.1–0.9)
Monocytes: 9 %
Neutrophils Absolute: 3.6 10*3/uL (ref 1.4–7.0)
Neutrophils: 54 %
Platelets: 237 10*3/uL (ref 150–379)
RBC: 4.5 x10E6/uL (ref 3.77–5.28)
RDW: 12.8 % (ref 12.3–15.4)
WBC: 6.8 10*3/uL (ref 3.4–10.8)

## 2017-02-11 MED ORDER — IOPAMIDOL (ISOVUE-300) INJECTION 61%
100.0000 mL | Freq: Once | INTRAVENOUS | Status: AC | PRN
  Administered 2017-02-11: 100 mL via INTRAVENOUS

## 2017-02-11 NOTE — Telephone Encounter (Signed)
Patient called for results of abdominal/pelvic ultrasound and CT scan abdomen pelvis with contrast as recommended by [facility]ologist at [facility] after ultrasound results. She was seen for pelvic pain/ left lower quadrant pain on 02/10/17 in [place].  Results as below reviewed with patient, it appears she also has a fluid filled cyst in 2013 by ultrasound though location is documented slightly different. She reports she is a patient of [facility] [place]. She is advised to call her OBGYN office for appointment for pelvic exam and evaluation of below cystic lesion versus free fluid within the next 1- 2 weeks.  She is also advised to call the Gastric MD that she was referred to by her PCP - she reports she will call her  OBGYN and the Gastric MD to schedule appointment.  Labs CBC/ CMET are within normal limits.  Patient verbalized understanding of all instructions given and denies any further questions at this time.    ULTRASOUND TRANSVAGINAL PELVIS  IMPRESSION: 1. Normal appearance of the uterus and ovaries. No evidence for torsion or adnexal mass. 2. Fluid collection in the cul-de-sac measuring up to 6.3 cm, compatible with cystic lesion versus free fluid. Consider further evaluation with CT of the pelvis with intravenous and oral contrast. These results will be called to the ordering clinician or representative by the Radiologist Assistant, and communication documented in the PACS or zVision Dashboard.   Electronically Signed   By: Norva PavlovElizabeth  Brown M.D.   On: 02/11/2017 08:58  CT SCAN ABDOMEN?PELVIS WITH CONTRAST   IMPRESSION: 6 cm benign-appearing cystic lesion in pelvic cul-de-sac. Recommend followup by pelvic ultrasound in 12 weeks. This recommendation follows ACR consensus guidelines: White Paper of the ACR Incidental Findings Committee II on Adnexal Findings. J Am Coll Radiol [id number]:10:675-681.   Electronically Signed   By: Myles RosenthalJohn  Stahl M.D.   On:  02/11/2017 11:56  Recent Results (from the past 2160 hour(s))  CBC w/Diff     Status: None   Collection Time: 02/10/17  2:34 PM  Result Value Ref Range   WBC 6.8 3.4 - 10.8 x10E3/uL   RBC 4.50 3.77 - 5.28 x10E6/uL   Hemoglobin 13.6 11.1 - 15.9 g/dL   Hematocrit 95.639.1 21.334.0 - 46.6 %   MCV 87 79 - 97 fL   MCH 30.2 26.6 - 33.0 pg   MCHC 34.8 31.5 - 35.7 g/dL   RDW 08.612.8 57.812.3 - 46.915.4 %   Platelets 237 150 - 379 x10E3/uL   Neutrophils 54 Not Estab. %   Lymphs 35 Not Estab. %   Monocytes 9 Not Estab. %   Eos 2 Not Estab. %   Basos 0 Not Estab. %   Neutrophils Absolute 3.6 1.4 - 7.0 x10E3/uL   Lymphocytes Absolute 2.4 0.7 - 3.1 x10E3/uL   Monocytes Absolute 0.6 0.1 - 0.9 x10E3/uL   EOS (ABSOLUTE) 0.2 0.0 - 0.4 x10E3/uL   Basophils Absolute 0.0 0.0 - 0.2 x10E3/uL   Immature Granulocytes 0 Not Estab. %   Immature Grans (Abs) 0.0 0.0 - 0.1 x10E3/uL  Comprehensive metabolic panel     Status: None   Collection Time: 02/10/17  2:34 PM  Result Value Ref Range   Glucose 86 65 - 99 mg/dL    Comment: Specimen received in contact with cells. No visible hemolysis present. However GLUC may be decreased and K increased. Clinical correlation indicated.    BUN 8 6 - 20 mg/dL   Creatinine, Ser 6.290.75 0.57 - 1.00 mg/dL   GFR calc  non Af Amer 107 >59 mL/min/1.73   GFR calc Af Amer 123 >59 mL/min/1.73   BUN/Creatinine Ratio 11 9 - 23   Sodium 139 134 - 144 mmol/L   Potassium 4.3 3.5 - 5.2 mmol/L    Comment: Specimen received in contact with cells. No visible hemolysis present. However GLUC may be decreased and K increased. Clinical correlation indicated.    Chloride 103 96 - 106 mmol/L   CO2 23 20 - 29 mmol/L   Calcium 9.6 8.7 - 10.2 mg/dL   Total Protein 7.3 6.0 - 8.5 g/dL   Albumin 4.7 3.5 - 5.5 g/dL   Globulin, Total 2.6 1.5 - 4.5 g/dL   Albumin/Globulin Ratio 1.8 1.2 - 2.2   Bilirubin Total 0.9 0.0 - 1.2 mg/dL   Alkaline Phosphatase 49 39 - 117 IU/L   AST 16 0 - 40 IU/L   ALT 15 0 - 32  IU/L  POCT urinalysis dipstick     Status: Normal   Collection Time: 02/10/17  3:23 PM  Result Value Ref Range   Color, UA yellow    Clarity, UA clear    Glucose, UA neg    Bilirubin, UA neg    Ketones, UA neg    Spec Grav, UA 1.010 1.010 - 1.025   Blood, UA neg    pH, UA 6.0 5.0 - 8.0   Protein, UA neg    Urobilinogen, UA 1.0 0.2 or 1.0 E.U./dL   Nitrite, UA neg    Leukocytes, UA Negative Negative  POCT urine pregnancy     Status: Normal   Collection Time: 02/10/17  3:26 PM  Result Value Ref Range   Preg Test, Ur Negative Negative

## 2017-02-16 DIAGNOSIS — R1900 Intra-abdominal and pelvic swelling, mass and lump, unspecified site: Secondary | ICD-10-CM | POA: Insufficient documentation

## 2017-05-19 ENCOUNTER — Ambulatory Visit: Payer: Self-pay | Admitting: Adult Health

## 2017-05-19 ENCOUNTER — Encounter: Payer: Self-pay | Admitting: Adult Health

## 2017-05-19 VITALS — BP 122/82 | HR 78 | Temp 99.5°F | Ht 65.0 in | Wt 148.8 lb

## 2017-05-19 DIAGNOSIS — J0100 Acute maxillary sinusitis, unspecified: Secondary | ICD-10-CM

## 2017-05-19 MED ORDER — AMOXICILLIN-POT CLAVULANATE 875-125 MG PO TABS: 1.0000 | ORAL_TABLET | Freq: Two times a day (BID) | ORAL | 0 refills | Status: DC

## 2017-05-19 NOTE — Patient Instructions (Signed)

## 2017-05-19 NOTE — Progress Notes (Signed)
Subjective:     Patient ID: Monique [name], female   DOB: 05/25/1985, 32 y.o.   MRN: [id number]   Blood pressure 122/82, pulse 78, temperature 99.5 F (37.5 C), temperature source Tympanic, height 5\' 5"  (1.651 m), weight 148 lb 12.8 oz (67.5 kg), last menstrual period 05/11/2017, SpO2 98 %.  Sinusitis  This is a new problem. The current episode started 1 to 4 weeks ago (3 weeks ). The problem has been gradually worsening since onset. There has been no fever. Fever duration: denies  Her pain is at a severity of 0/10. She is experiencing no pain. Associated symptoms include congestion, coughing (post nasal drip cough ), ear pain (right ear pressure ), headaches (none today but in past week ), sinus pressure and sneezing. Pertinent negatives include no chills, diaphoresis, hoarse voice, neck pain, shortness of breath, sore throat or swollen glands. Past treatments include oral decongestants and saline sprays. The treatment provided mild relief.      Review of Systems  Constitutional: Negative for activity change, appetite change, chills, diaphoresis, fatigue, fever and unexpected weight change.  HENT: Positive for congestion, ear pain (right ear pressure ), postnasal drip, rhinorrhea, sinus pressure and sneezing. Negative for dental problem, drooling, ear discharge, facial swelling, hearing loss, hoarse voice, mouth sores, nosebleeds, sinus pain, sore throat, tinnitus, trouble swallowing and voice change.   Eyes: Negative.   Respiratory: Positive for cough (post nasal drip cough ). Negative for apnea, choking, chest tightness, shortness of breath, wheezing and stridor.   Cardiovascular: Negative for chest pain, palpitations and leg swelling.  Gastrointestinal: Negative.   Endocrine: Negative.   Genitourinary: Negative.   Musculoskeletal: Negative.  Negative for neck pain.  Neurological: Positive for headaches (none today but in past week ).  Psychiatric/Behavioral: Negative.        Objective:   Physical Exam  Constitutional: She is oriented to person, place, and time. She appears well-developed and well-nourished. No distress.  HENT:  Head: Normocephalic and atraumatic.  Right Ear: Hearing normal. No drainage, swelling or tenderness. No foreign bodies. Tympanic membrane is erythematous. Tympanic membrane is not perforated. A middle ear effusion is present. No decreased hearing is noted.  Left Ear: Hearing normal. No drainage, swelling or tenderness. No foreign bodies. Tympanic membrane is erythematous (mild pinkness bilaterally ). Tympanic membrane is not perforated. A middle ear effusion is present. No decreased hearing is noted.  Nose: Mucosal edema and rhinorrhea present. Right sinus exhibits maxillary sinus tenderness. Right sinus exhibits no frontal sinus tenderness. Left sinus exhibits maxillary sinus tenderness. Left sinus exhibits no frontal sinus tenderness.  Mouth/Throat: Uvula is midline and mucous membranes are normal. Posterior oropharyngeal erythema present. No oropharyngeal exudate, posterior oropharyngeal edema or tonsillar abscesses.  eczema bilateral ears/ mildly in ear canals and has creams and drops at time and uses intermittent - has mild eczema flare right ear currently - dry skin colored  skin of outer ear only   Eyes: Conjunctivae and EOM are normal. Pupils are equal, round, and reactive to light. Right eye exhibits no discharge. Left eye exhibits no discharge. No scleral icterus.  Neck: Trachea normal, normal range of motion, full passive range of motion without pain and phonation normal. Neck supple. Normal carotid pulses, no hepatojugular reflux and no JVD present. Carotid bruit is not present. No tracheal deviation present. No Brudzinski's sign noted.  Cardiovascular: Normal rate, regular rhythm, normal heart sounds and intact distal pulses. Exam reveals no gallop and no friction rub.  No murmur heard.  Pulmonary/Chest: Effort normal and breath sounds normal. No  stridor. No respiratory distress. She has no wheezes. She has no rales. She exhibits no tenderness.  Abdominal: Soft. Bowel sounds are normal.  Musculoskeletal: Normal range of motion.  Lymphadenopathy:    She has no cervical adenopathy.  Neurological: She is alert and oriented to person, place, and time. She displays normal reflexes. No cranial nerve deficit. She exhibits normal muscle tone. Coordination normal.  Skin: Skin is warm and dry. No rash noted. She is not diaphoretic. No cyanosis or erythema. No pallor. Nails show no clubbing.  Psychiatric: She has a normal mood and affect. Her speech is normal and behavior is normal. Judgment and thought content normal. Cognition and memory are normal.  Nursing note and vitals reviewed.      Assessment:     Acute non-recurrent maxillary sinusitis      Plan:     Meds ordered this encounter  Medications  . amoxicillin-clavulanate (AUGMENTIN) 875-125 MG tablet    Sig: Take 1 tablet by mouth 2 (two) times daily.    Dispense:  20 tablet    Refill:  0   Advised to return to clinic for an appointment if no improvement within 72 hours or if any symptoms change or worsen. Advised ER or urgent Care if after hours or on weekend. 911 for emergency symptoms at any time.   Patient verbalized understanding of all instructions given and denies any further questions at this time.

## 2017-05-31 ENCOUNTER — Encounter: Payer: Self-pay | Admitting: Adult Health

## 2017-06-02 ENCOUNTER — Encounter: Payer: Self-pay | Admitting: Adult Health

## 2018-03-24 ENCOUNTER — Ambulatory Visit: Payer: Self-pay | Admitting: Adult Health

## 2018-03-24 ENCOUNTER — Encounter: Payer: Self-pay | Admitting: Adult Health

## 2018-03-24 VITALS — BP 124/80 | HR 89 | Temp 98.2°F | Ht 65.0 in | Wt 151.0 lb

## 2018-03-24 DIAGNOSIS — J0100 Acute maxillary sinusitis, unspecified: Secondary | ICD-10-CM

## 2018-03-24 DIAGNOSIS — R05 Cough: Secondary | ICD-10-CM

## 2018-03-24 DIAGNOSIS — R059 Cough, unspecified: Secondary | ICD-10-CM

## 2018-03-24 DIAGNOSIS — H66002 Acute suppurative otitis media without spontaneous rupture of ear drum, left ear: Secondary | ICD-10-CM

## 2018-03-24 DIAGNOSIS — Z20828 Contact with and (suspected) exposure to other viral communicable diseases: Secondary | ICD-10-CM

## 2018-03-24 LAB — POCT INFLUENZA A/B
INFLUENZA B, POC: NEGATIVE
Influenza A, POC: NEGATIVE

## 2018-03-24 MED ORDER — HYDROCOD POLST-CPM POLST ER 10-8 MG/5ML PO SUER: 5.0000 mL | Freq: Every evening | ORAL | 0 refills | Status: DC | PRN

## 2018-03-24 MED ORDER — AMOXICILLIN-POT CLAVULANATE 875-125 MG PO TABS: 1.0000 | ORAL_TABLET | Freq: Two times a day (BID) | ORAL | 0 refills | Status: DC

## 2018-03-24 NOTE — Progress Notes (Signed)
Subjective:     Patient ID: Monique [name], female   DOB: 09/05/1985, 32 y.o.   MRN: [id number]  Blood pressure 124/80, pulse 89, temperature 98.2 F (36.8 C), height 5\' 5"  (1.651 m), weight 151 lb (68.5 kg), SpO2 100 %.  Patient is a 32 year old female in no acute distress who comes to the clinic with complaints of cough and congestion x 2 weeks. Husband had flu positive on 03/24/18. She reports her symptoms started with dsorethroat and that has resolved.  She did have chills and body aches flu like symptoms 2 weeks ago that resolved on week one. Denies any of these symptoms currently.   Cough. Keeping her up at night. Yellow sputum.   Patient  denies any fever, body aches,chills, rash, chest pain, shortness of breath, nausea, vomiting, or diarrhea.    LMP -03/02/2018  Allergies  Allergen Reactions  . No Known Allergies     Husband with Flu A positive on 03/23/18, children have also had a cough.  Review of Systems  Constitutional: Positive for fatigue. Negative for activity change, appetite change, chills, diaphoresis, fever and unexpected weight change.  HENT: Positive for congestion, postnasal drip, rhinorrhea and sinus pressure. Negative for dental problem, drooling, ear discharge, ear pain, facial swelling, hearing loss, mouth sores, nosebleeds, sinus pain, sneezing, sore throat, tinnitus, trouble swallowing and voice change.        Denies wheezing   Eyes: Negative.   Respiratory: Positive for cough and stridor. Negative for apnea, choking, chest tightness, shortness of breath and wheezing.   Cardiovascular: Negative.   Gastrointestinal: Negative.   Genitourinary: Negative.   Musculoskeletal: Negative.   Skin: Negative.   Allergic/Immunologic: Negative.   Neurological: Negative.   Hematological: Negative.   Psychiatric/Behavioral: Negative.        Objective:   Physical Exam Vitals signs reviewed.  Constitutional:      General: She is not in acute distress.  Appearance: She is well-developed and normal weight. She is not ill-appearing.  HENT:     Head: Normocephalic and atraumatic.     Right Ear: Hearing and external ear normal. A middle ear effusion (darker yellow/ brown fluid behind intact tympanic membrane ) is present. Tympanic membrane is erythematous.     Left Ear: Hearing and external ear normal. A middle ear effusion is present. Tympanic membrane is erythematous.     Ears:     Comments: Dry skin in ears bilaterally patient has history of eczema.     Nose: Congestion and rhinorrhea present. No mucosal edema. Rhinorrhea is purulent.     Right Sinus: No maxillary sinus tenderness or frontal sinus tenderness.     Left Sinus: No maxillary sinus tenderness or frontal sinus tenderness.     Comments: Yellow discharge     Mouth/Throat:     Mouth: Mucous membranes are moist. No oral lesions.     Pharynx: Posterior oropharyngeal erythema present. No pharyngeal swelling, oropharyngeal exudate or uvula swelling.     Tonsils: No tonsillar exudate or tonsillar abscesses. Swelling: 0 on the right. 0 on the left.  Eyes:     General: Lids are normal.     Extraocular Movements:     Right eye: Normal extraocular motion.     Left eye: Normal extraocular motion.     Conjunctiva/sclera: Conjunctivae normal.     Pupils: Pupils are equal, round, and reactive to light.  Neck:     Musculoskeletal: Normal range of motion and neck supple.  Thyroid: No thyromegaly.  Cardiovascular:     Rate and Rhythm: Normal rate and regular rhythm.     Heart sounds: Normal heart sounds. No murmur. No friction rub. No gallop.   Pulmonary:     Effort: Pulmonary effort is normal.  Abdominal:     Palpations: Abdomen is soft.  Lymphadenopathy:     Head:     Right side of head: No submental, submandibular, tonsillar, preauricular, posterior auricular or occipital adenopathy.     Left side of head: No submental, submandibular, tonsillar, preauricular, posterior auricular or  occipital adenopathy.     Cervical: No cervical adenopathy.  Skin:    General: Skin is warm and dry.     Capillary Refill: Capillary refill takes less than 2 seconds.  Neurological:     General: No focal deficit present.     Mental Status: She is alert.  Psychiatric:        Attention and Perception: Attention normal.        Mood and Affect: Mood normal.        Speech: Speech normal.        Behavior: Behavior normal. Behavior is cooperative.        Thought Content: Thought content normal.        Cognition and Memory: Cognition normal.        Judgment: Judgment normal.        Assessment:    Cough  Non-recurrent acute suppurative otitis media of left ear without spontaneous rupture of tympanic membrane  Acute non-recurrent maxillary sinusitis  Exposure to the flu - Plan: POCT Influenza A/B      Results for orders placed or performed in visit on 03/24/18 (from the past 24 hour(s))  POCT Influenza A/B     Status: Normal   Collection Time: 03/24/18 11:05 AM  Result Value Ref Range   Influenza A, POC Negative Negative   Influenza B, POC Negative Negative   Plan:     Meds ordered this encounter  Medications  . amoxicillin-clavulanate (AUGMENTIN) 875-125 MG tablet    Sig: Take 1 tablet by mouth 2 (two) times daily.    Dispense:  20 tablet    Refill:  0  . chlorpheniramine-HYDROcodone (TUSSIONEX PENNKINETIC ER) 10-8 MG/5ML SUER    Sig: Take 5 mLs by mouth at bedtime as needed for cough.    Dispense:  60 mL    Refill:  0   Discussed her symptoms were likely Flu 2 weeks ago given her husbands symptoms and flu A positive on 03/24/2018. Gave and reviewed After Visit Summary(AVS) with patient.  Discussed only using cough syrup as needed at bedtime. She is to not drive, operate machinery or make decisions while using.  Seek medical care immediately should any symptoms worsen at any time.  Provider thoroughly discussed in collaboration above plan with supervising physician Dr.  Julieanne Mansonichard Gilbert who is in agreement with the care plan as above.    Advised patient call the office or your primary care doctor for an appointment if no improvement within 72 hours or if any symptoms change or worsen at any time  Advised ER or urgent Care if after hours or on weekend. Call 911 for emergency symptoms at any time.Patinet verbalized understanding of all instructions given/reviewed and treatment plan and has no further questions or concerns at this time.    Patient verbalized understanding of all instructions given and denies any further questions at this time.

## 2018-03-24 NOTE — Patient Instructions (Signed)
Amoxicillin; Clavulanic Acid tablets What is this medicine? AMOXICILLIN; CLAVULANIC ACID (a mox i SIL in; KLAV yoo lan ic AS id) is a penicillin antibiotic. It is used to treat certain kinds of bacterial infections. It will not work for colds, flu, or other viral infections. This medicine may be used for other purposes; ask your health care provider or pharmacist if you have questions. COMMON BRAND NAME(S): Augmentin What should I tell my health care provider before I take this medicine? They need to know if you have any of these conditions: -bowel disease, like colitis -kidney disease -liver disease -mononucleosis -an unusual or allergic reaction to amoxicillin, penicillin, cephalosporin, other antibiotics, clavulanic acid, other medicines, foods, dyes, or preservatives -pregnant or trying to get pregnant -breast-feeding How should I use this medicine? Take this medicine by mouth with a full glass of water. Follow the directions on the prescription label. Take at the start of a meal. Do not crush or chew. If the tablet has a score line, you may cut it in half at the score line for easier swallowing. Take your medicine at regular intervals. Do not take your medicine more often than directed. Take all of your medicine as directed even if you think you are better. Do not skip doses or stop your medicine early. Talk to your pediatrician regarding the use of this medicine in children. Special care may be needed. Overdosage: If you think you have taken too much of this medicine contact a poison control center or emergency room at once. NOTE: This medicine is only for you. Do not share this medicine with others. What if I miss a dose? If you miss a dose, take it as soon as you can. If it is almost time for your next dose, take only that dose. Do not take double or extra doses. What may interact with this medicine? -allopurinol -anticoagulants -birth control pills -methotrexate -probenecid This  list may not describe all possible interactions. Give your health care provider a list of all the medicines, herbs, non-prescription drugs, or dietary supplements you use. Also tell them if you smoke, drink alcohol, or use illegal drugs. Some items may interact with your medicine. What should I watch for while using this medicine? Tell your doctor or health care professional if your symptoms do not improve. Do not treat diarrhea with over the counter products. Contact your doctor if you have diarrhea that lasts more than 2 days or if it is severe and watery. If you have diabetes, you may get a false-positive result for sugar in your urine. Check with your doctor or health care professional. Birth control pills may not work properly while you are taking this medicine. Talk to your doctor about using an extra method of birth control. What side effects may I notice from receiving this medicine? Side effects that you should report to your doctor or health care professional as soon as possible: -allergic reactions like skin rash, itching or hives, swelling of the face, lips, or tongue -breathing problems -dark urine -fever or chills, sore throat -redness, blistering, peeling or loosening of the skin, including inside the mouth -seizures -trouble passing urine or change in the amount of urine -unusual bleeding, bruising -unusually weak or tired -white patches or sores in the mouth or throat Side effects that usually do not require medical attention (report to your doctor or health care professional if they continue or are bothersome): -diarrhea -dizziness -headache -nausea, vomiting -stomach upset -vaginal or anal irritation This list may   not describe all possible side effects. Call your doctor for medical advice about side effects. You may report side effects to FDA at 1-800-FDA-1088. Where should I keep my medicine? Keep out of the reach of children. Store at room temperature below 25 degrees  C (77 degrees F). Keep container tightly closed. Throw away any unused medicine after the expiration date. NOTE: This sheet is a summary. It may not cover all possible information. If you have questions about this medicine, talk to your doctor, pharmacist, or health care provider.  2019 Elsevier/Gold Standard (2007-06-16 12:04:30) Chlorpheniramine; Hydrocodone oral solution or suspension What is this medicine? CHLORPHENIRAMINE; HYDROCODONE (klor fen IR a meen; hye droe KOE done) is a combination of an antihistamine and cough suppressant. It is used to treat the symptoms of allergies and colds. This medicine may be used for other purposes; ask your health care provider or pharmacist if you have questions. COMMON BRAND NAME(S): HyTan, Novasus, S-T Forte 2, Tussionex, VITUZ What should I tell my health care provider before I take this medicine? They need to know if you have any of these conditions: -Addison's disease -brain tumor -gallbladder disease -glaucoma -head injury -heart disease -history of a drug or alcohol abuse problem -history of irregular heartbeat -if you often drink alcohol -kidney disease -liver disease -low blood pressure -lung or breathing disease, like asthma -mental illness -pancreatic disease -seizures -stomach or intestine problems -thyroid disease -trouble passing urine -an unusual or allergic reaction to chlorpheniramine, hydrocodone, other medicines, foods, dyes, or preservatives -pregnant or trying to get pregnant -breast-feeding How should I use this medicine? Take this medicine by mouth. Follow the directions on the prescription label. Shake well before using. You can take it with or without food. If it upsets your stomach, take it with food. Use a specially marked spoon or container to measure each dose. Ask your pharmacist if you do not have one. Household spoons are not accurate. Do not to overfill. Rinse the measuring device with water after each use.  Take your medicine at regular intervals. Do not take it more often than directed. A special MedGuide will be given to you by the pharmacist with each prescription and refill. Be sure to read this information carefully each time. Talk to your pediatrician regarding the use of this medicine in children. This medicine is not approved for use in children. Overdosage: If you think you have taken too much of this medicine contact a poison control center or emergency room at once. NOTE: This medicine is only for you. Do not share this medicine with others. What if I miss a dose? If you miss a dose, take it as soon as you can. If it is almost time for your next dose, take only that dose. Do not take double or extra doses. What may interact with this medicine? Do not take this medicine with any of the following medications: -alcohol -certain medicines for anxiety or sleep -certain medicines for depression like amitriptyline, fluoxetine, sertraline -certain medicines for seizures like phenobarbital, phenytoin, primidone -general anesthetics like halothane, isoflurane, methoxyflurane, propofol -local anesthetics like lidocaine, pramoxine, tetracaine -MAOIs like Carbex, Eldepryl, Nardil, and Parnate -other antihistamines for allergy, cough and cold -other narcotic medicines (opiates) for pain or cough -phenothiazines like chlorpromazine, mesoridazine, prochlorperazine, thioridazine This medicine may also interact with the following medications: -antiviral medicines for HIV or AIDS -atropine -certain antibiotics like clarithromycin, erythromycin -certain medicines for bladder problems like oxybutynin, tolterodine -certain medicines for fungal infections like ketoconazole and itraconazole -certain medicines  for Parkinson's disease like benztropine, trihexyphenidyl -certain medicines for stomach problems like dicyclomine, hyoscyamine -certain medicines for travel sickness like  scopolamine -ipratropium -rifampin This list may not describe all possible interactions. Give your health care provider a list of all the medicines, herbs, non-prescription drugs, or dietary supplements you use. Also tell them if you smoke, drink alcohol, or use illegal drugs. Some items may interact with your medicine. What should I watch for while using this medicine? Use exactly as directed by your doctor or health care professional. Do not take more than the recommended dose. You may develop tolerance to this medicine if you take it for a long time. Tolerance means that you will get less cough relief with time. Tell your doctor or health care professional if your symptoms do not improve or if they get worse. If you have been taking this medicine for a long time, do not suddenly stop taking it because you may develop a severe reaction. Your body becomes used to the medicine. This does NOT mean you are addicted. Addiction is a behavior related to getting and using a drug for a nonmedical reason. If your doctor wants you to stop the medicine, the dose will be slowly lowered over time to avoid any side effects. There are different types of narcotic medicines (opiates). If you take more than one type at the same time or if you are taking another medicine that also causes drowsiness, you may have more side effects. Give your health care provider a list of all medicines you use. Your doctor will tell you how much medicine to take. Do not take more medicine than directed. Call emergency for help if you have problems breathing or unusual sleepiness. You may get drowsy or dizzy. Do not drive, use machinery, or do anything that needs mental alertness until you know how this medicine affects you. Do not stand or sit up quickly, especially if you are an older patient. This reduces the risk of dizzy or fainting spells. Alcohol may interfere with the effect of this medicine. Avoid alcoholic drinks. The medicine will  cause constipation. Try to have a bowel movement at least every 2 to 3 days. If you do not have a bowel movement for 3 days, call your doctor or health care professional. Your mouth may get dry. Chewing sugarless gum or sucking hard candy, and drinking plenty of water may help. Contact your doctor if the problem does not go away or is severe. This medicine may cause dry eyes and blurred vision. If you wear contact lenses, you may feel some discomfort. Lubricating drops may help. See your eye doctor if the problem does not go away or is severe. What side effects may I notice from receiving this medicine? Side effects that you should report to your doctor or health care professional as soon as possible: -allergic reactions like skin rash, itching or hives, swelling of the face, lips, or tongue -breathing problems -confusion -signs and symptoms of low blood pressure like dizziness; feeling faint or lightheaded, falls; unusually weak or tired -trouble passing urine or change in the amount of urine Side effects that usually do not require medical attention (report to your doctor or health care professional if they continue or are bothersome): -constipation -dry mouth -nausea, vomiting -tiredness This list may not describe all possible side effects. Call your doctor for medical advice about side effects. You may report side effects to FDA at 1-800-FDA-1088. Where should I keep my medicine? Keep out of the  reach of children. This medicine can be abused. Keep this medicine in a safe place to protect it from theft. Do not share this medicine with anyone. Selling or giving away this medicine is dangerous and is against the law. This medicine may cause accidental overdose and death if taken by other adults, children, or pets. Mix any unused medicine with a substance like cat littler or coffee grounds. Then throw the medicine away in a sealed container like a sealed bag or a coffee can with a lid. Do not use  the medicine after the expiration date. Store at room temperature between 15 and 30 degrees C (59 and 86 degrees F). Do not freeze. Keep container tightly closed. NOTE: This sheet is a summary. It may not cover all possible information. If you have questions about this medicine, talk to your doctor, pharmacist, or health care provider.  2019 Elsevier/Gold Standard (2016-10-20 16:10:22) Otitis Media, Adult  Otitis media means that the middle ear is red and swollen (inflamed) and full of fluid. The condition usually goes away on its own. Follow these instructions at home:  Take over-the-counter and prescription medicines only as told by your doctor.  If you were prescribed an antibiotic medicine, take it as told by your doctor. Do not stop taking the antibiotic even if you start to feel better.  Keep all follow-up visits as told by your doctor. This is important. Contact a doctor if:  You have bleeding from your nose.  There is a lump on your neck.  You are not getting better in 5 days.  You feel worse instead of better. Get help right away if:  You have pain that is not helped with medicine.  You have swelling, redness, or pain around your ear.  You get a stiff neck.  You cannot move part of your face (paralyzed).  You notice that the bone behind your ear hurts when you touch it.  You get a very bad headache. Summary  Otitis media means that the middle ear is red, swollen, and full of fluid.  This condition usually goes away on its own. In some cases, treatment may be needed.  If you were prescribed an antibiotic medicine, take it as told by your doctor. This information is not intended to replace advice given to you by your health care provider. Make sure you discuss any questions you have with your health care provider. Document Released: 09/09/2007 Document Revised: 04/13/2016 Document Reviewed: 04/13/2016 Elsevier Interactive Patient Education  2019 Elsevier  Inc. Cough, Adult  A cough helps to clear your throat and lungs. A cough may last only 2-3 weeks (acute), or it may last longer than 8 weeks (chronic). Many different things can cause a cough. A cough may be a sign of an illness or another medical condition. Follow these instructions at home:  Pay attention to any changes in your cough.  Take medicines only as told by your doctor. ? If you were prescribed an antibiotic medicine, take it as told by your doctor. Do not stop taking it even if you start to feel better. ? Talk with your doctor before you try using a cough medicine.  Drink enough fluid to keep your pee (urine) clear or pale yellow.  If the air is dry, use a cold steam vaporizer or humidifier in your home.  Stay away from things that make you cough at work or at home.  If your cough is worse at night, try using extra pillows to raise  your head up higher while you sleep.  Do not smoke, and try not to be around smoke. If you need help quitting, ask your doctor.  Do not have caffeine.  Do not drink alcohol.  Rest as needed. Contact a doctor if:  You have new problems (symptoms).  You cough up yellow fluid (pus).  Your cough does not get better after 2-3 weeks, or your cough gets worse.  Medicine does not help your cough and you are not sleeping well.  You have pain that gets worse or pain that is not helped with medicine.  You have a fever.  You are losing weight and you do not know why.  You have night sweats. Get help right away if:  You cough up blood.  You have trouble breathing.  Your heartbeat is very fast. This information is not intended to replace advice given to you by your health care provider. Make sure you discuss any questions you have with your health care provider. Document Released: 12/04/2010 Document Revised: 08/29/2015 Document Reviewed: 05/30/2014 Elsevier Interactive Patient Education  2019 [employer]

## 2019-01-18 IMAGING — CT CT ABD-PELV W/ CM
1 of 2 series · 15 of 32 positions shown, 19 images · IV contrast (OMNIPAQUE)
Comparison: Ultrasound on 02/11/2017

CLINICAL DATA: Left lower quadrant pain and nausea for 2 days.
Cystic pelvic lesion on recent ultrasound.

EXAM:
CT ABDOMEN AND PELVIS WITH CONTRAST
TECHNIQUE: Multidetector CT imaging of the abdomen and pelvis was performed
using the standard protocol following bolus administration of
intravenous contrast.
CONTRAST:  100mL [id number] IOPAMIDOL ([id number]) INJECTION 61%

[Series 2: routine abdomen/pelvis with · axial · 0.62mm/px · z∈[+562,+937]mm · 15 of 83 slices shown, 19 images]
[im 4/83  soft-tissue]
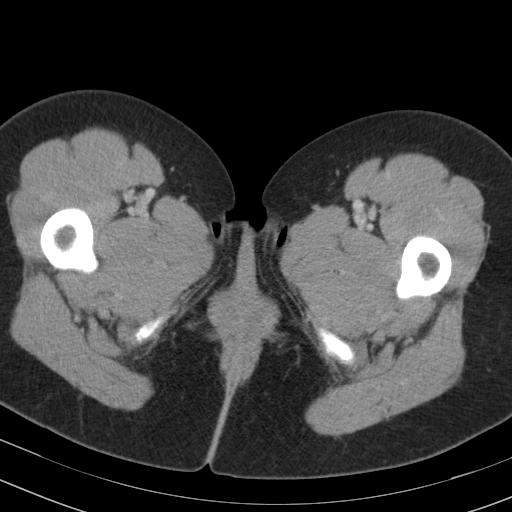
[im 4/83  bone]
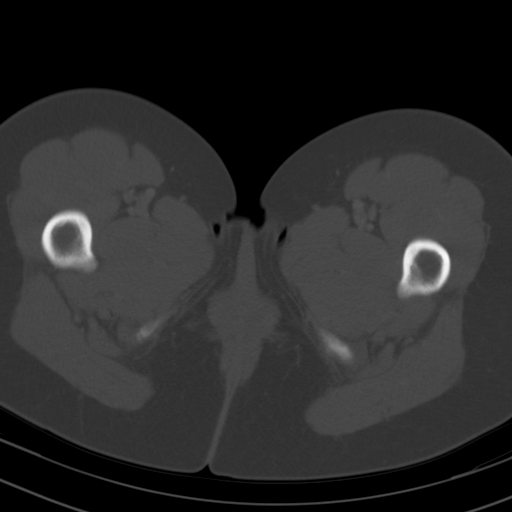
[im 10/83  soft-tissue]
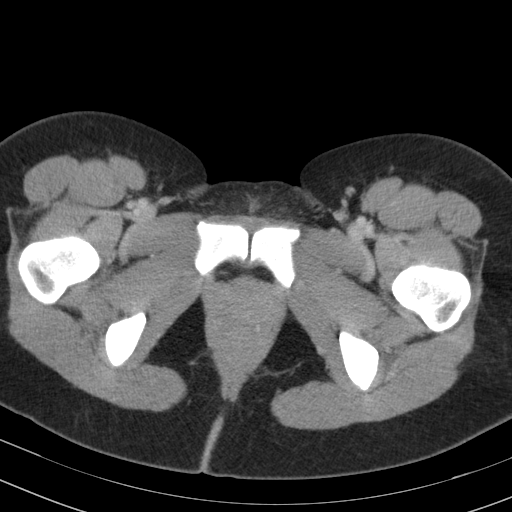
[im 17/83  soft-tissue]
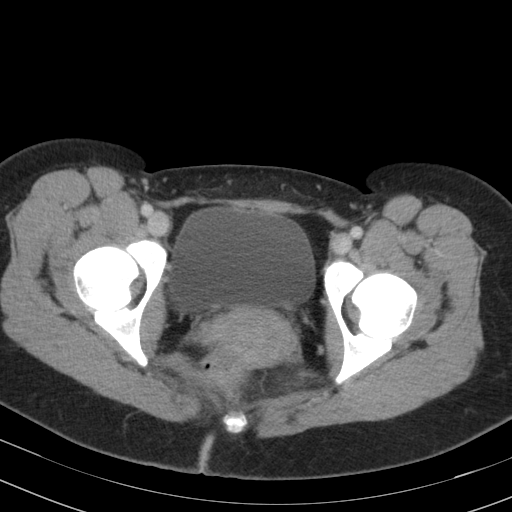
[im 23/83  soft-tissue]
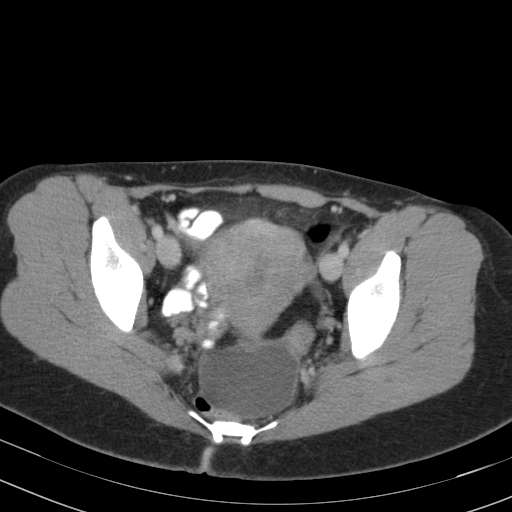
[im 30/83  soft-tissue]
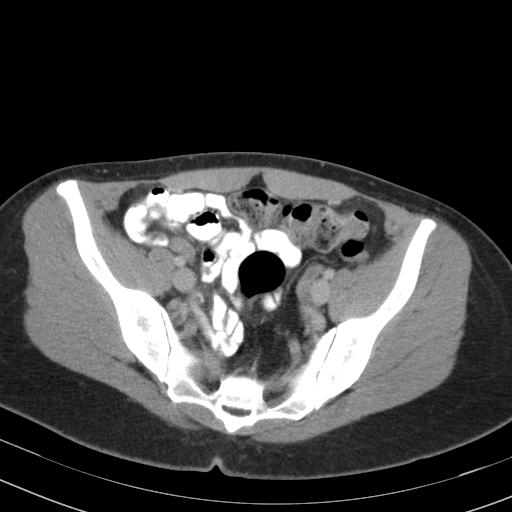
[im 37/83  soft-tissue]
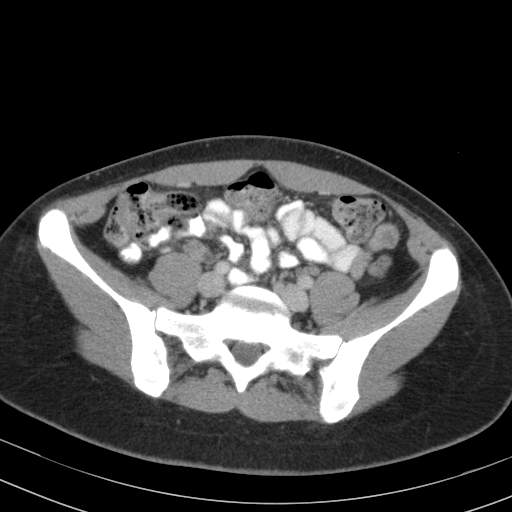
[im 43/83  soft-tissue]
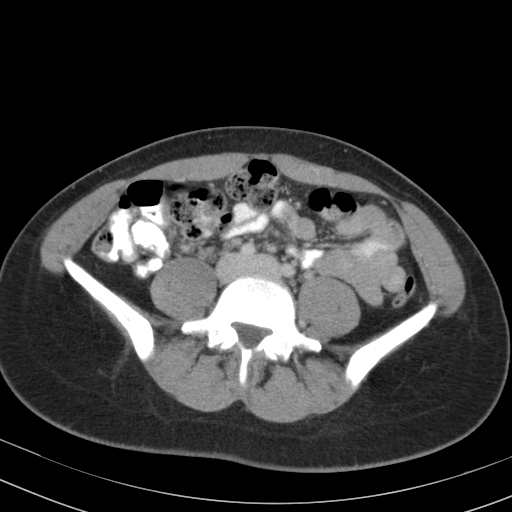
[im 46/83  soft-tissue]
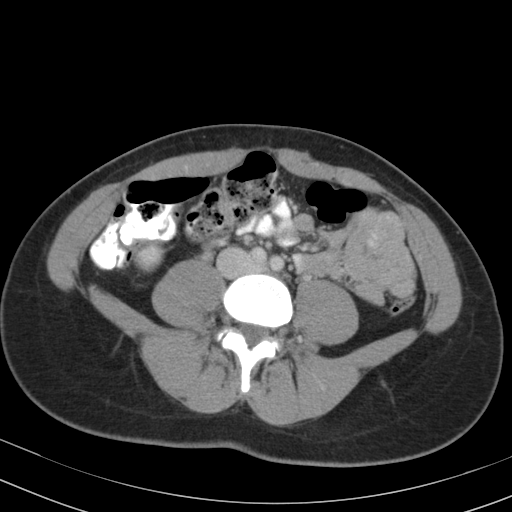
[im 53/83  soft-tissue]
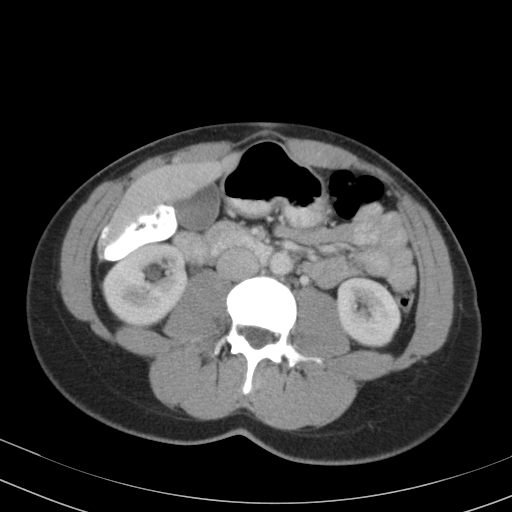
[im 53/83  bone]
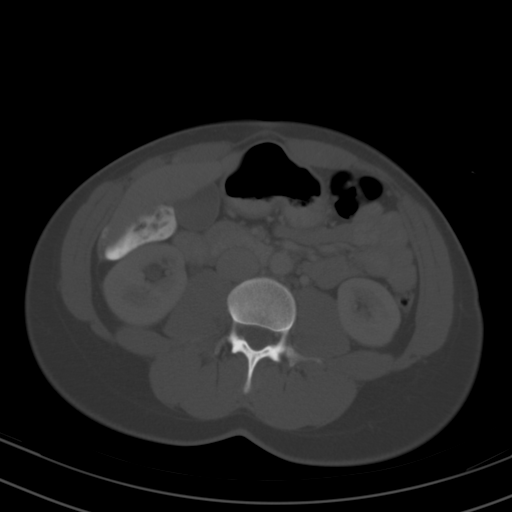
[im 60/83  soft-tissue]
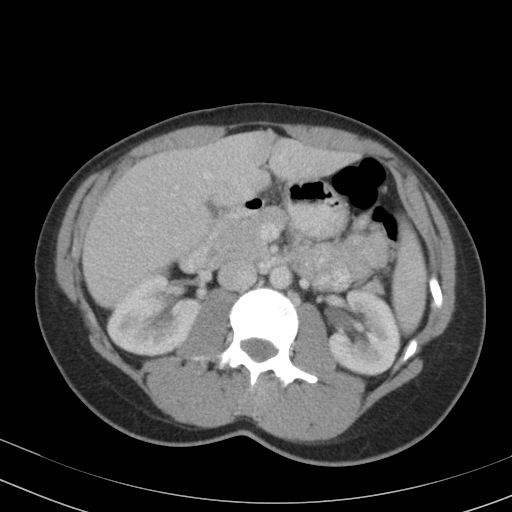
[im 66/83  soft-tissue]
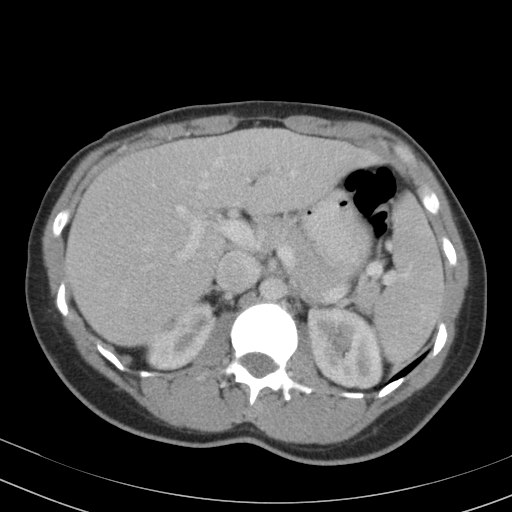
[im 69/83  lung]
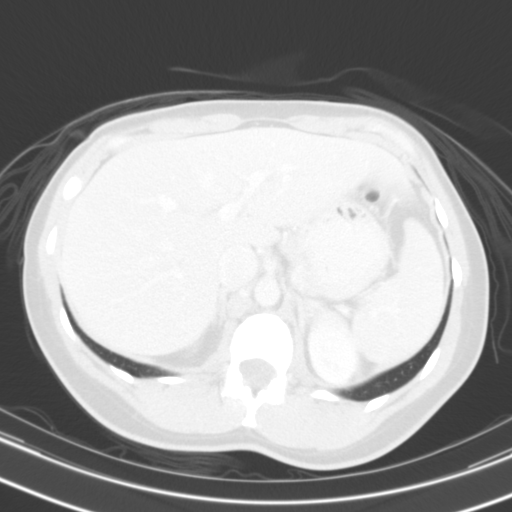
[im 73/83  soft-tissue]
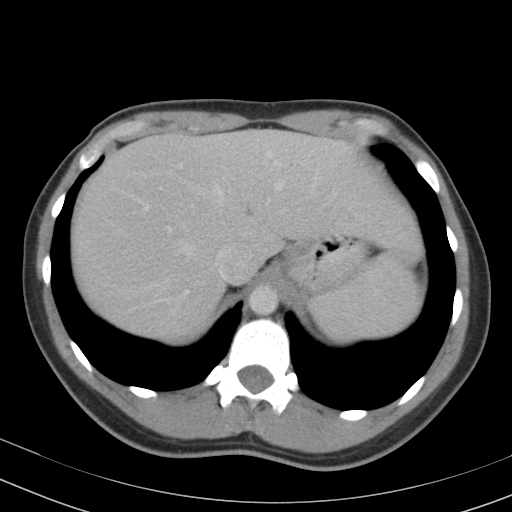
[im 73/83  lung]
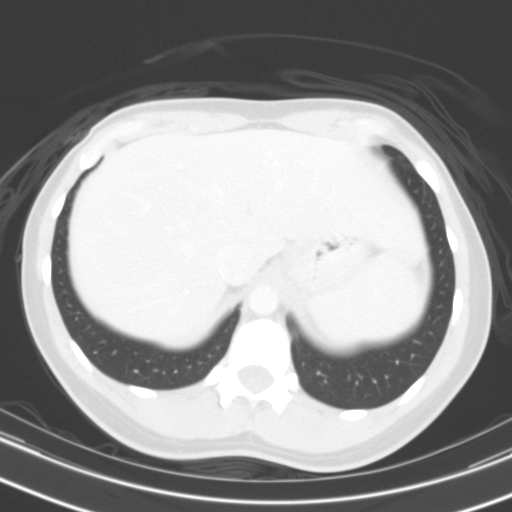
[im 76/83  lung]
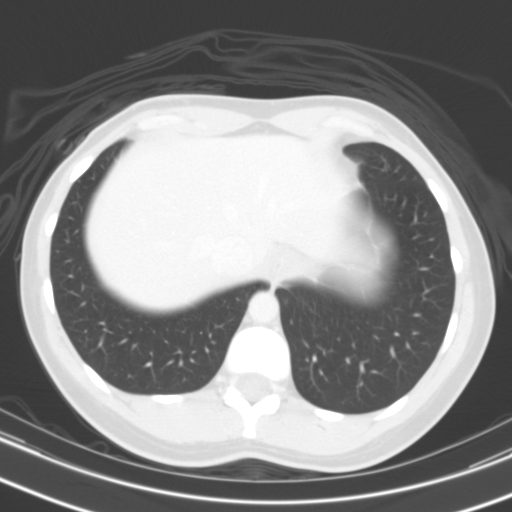
[im 79/83  soft-tissue]
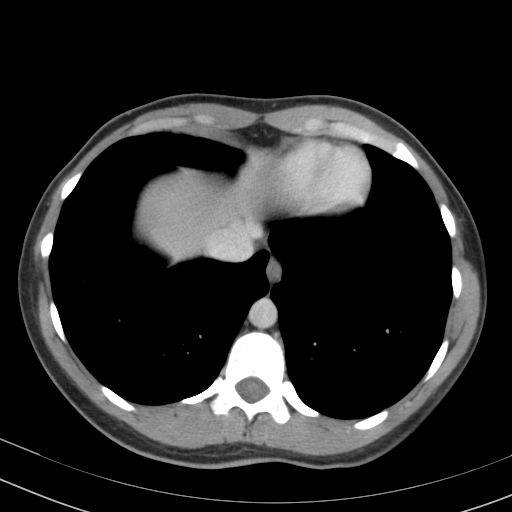
[im 79/83  lung]
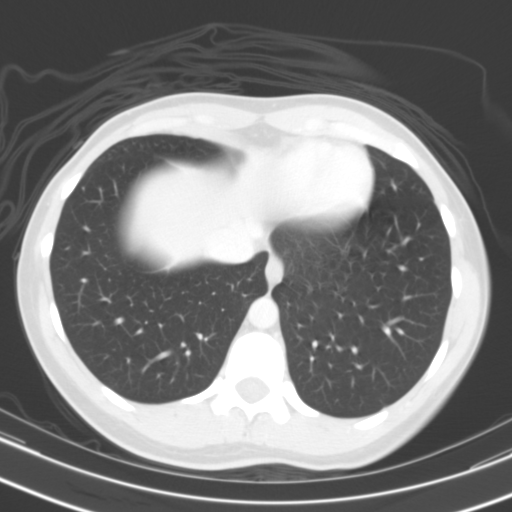

[15 of 32 positions shown; findings below may reference images not displayed]

FINDINGS: Lower Chest: No acute findings.

Hepatobiliary: No hepatic masses identified. Gallbladder is
unremarkable.

Pancreas:  No mass or inflammatory changes.

Spleen: Within normal limits in size and appearance.

Adrenals/Urinary Tract: No masses identified. No evidence of
hydronephrosis.

Stomach/Bowel: No evidence of obstruction, inflammatory process or
abnormal fluid collections.

Vascular/Lymphatic: No pathologically enlarged lymph nodes. No
abdominal aortic aneurysm.

Reproductive: Normal appearance of uterus. A simple appearing cystic
lesion is seen in the pelvic cul-de-sac which measures 6.1 x 4.7 cm.
No solid masses identified. No inflammatory changes or free fluid.

Other:  None.

Musculoskeletal:  No suspicious bone lesions identified.
IMPRESSION: 6 cm benign-appearing cystic lesion in pelvic cul-de-sac. Recommend
followup by pelvic ultrasound in 12 weeks. This recommendation
follows ACR consensus guidelines: White Paper of the ACR Incidental
Findings Committee II on Adnexal Findings. [HOSPITAL]

## 2021-03-18 ENCOUNTER — Other Ambulatory Visit: Payer: Self-pay

## 2021-03-18 ENCOUNTER — Ambulatory Visit: Payer: Self-pay | Admitting: Medical

## 2021-03-18 ENCOUNTER — Encounter: Payer: Self-pay | Admitting: Medical

## 2021-03-18 VITALS — BP 112/80 | HR 72 | Temp 98.7°F | Resp 16 | Wt 141.2 lb

## 2021-03-18 DIAGNOSIS — H6983 Other specified disorders of Eustachian tube, bilateral: Secondary | ICD-10-CM

## 2021-03-18 MED ORDER — PREDNISONE 10 MG (21) PO TBPK: ORAL_TABLET | ORAL | 0 refills | Status: DC

## 2021-03-18 NOTE — Patient Instructions (Signed)
Eustachian Tube Dysfunction °Eustachian tube dysfunction refers to a condition in which a blockage develops in the narrow passage that connects the middle ear to the back of the nose (eustachian tube). The eustachian tube regulates air pressure in the middle ear by letting air move between the ear and nose. It also helps to drain fluid from the middle ear space. °Eustachian tube dysfunction can affect one or both ears. When the eustachian tube does not function properly, air pressure, fluid, or both can build up in the middle ear. °What are the causes? °This condition occurs when the eustachian tube becomes blocked or cannot open normally. Common causes of this condition include: °Ear infections. °Colds and other infections that affect the nose, mouth, and throat (upper respiratory tract). °Allergies. °Irritation from cigarette smoke. °Irritation from stomach acid coming up into the esophagus (gastroesophageal reflux). The esophagus is the part of the body that moves food from the mouth to the stomach. °Sudden changes in air pressure, such as from descending in an airplane or scuba diving. °Abnormal growths in the nose or throat, such as: °Growths that line the nose (nasal polyps). °Abnormal growth of cells (tumors). °Enlarged tissue at the back of the throat (adenoids). °What increases the risk? °You are more likely to develop this condition if: °You smoke. °You are overweight. °You are a child who has: °Certain birth defects of the mouth, such as cleft palate. °Large tonsils or adenoids. °What are the signs or symptoms? °Common symptoms of this condition include: °A feeling of fullness in the ear. °Ear pain. °Clicking or popping noises in the ear. °Ringing in the ear (tinnitus). °Hearing loss. °Loss of balance. °Dizziness. °Symptoms may get worse when the air pressure around you changes, such as when you travel to an area of high elevation, fly on an airplane, or go scuba diving. °How is this diagnosed? °This  condition may be diagnosed based on: °Your symptoms. °A physical exam of your ears, nose, and throat. °Tests, such as those that measure: °The movement of your eardrum. °Your hearing (audiometry). °How is this treated? °Treatment depends on the cause and severity of your condition. °In mild cases, you may relieve your symptoms by moving air into your ears. This is called "popping the ears." °In more severe cases, or if you have symptoms of fluid in your ears, treatment may include: °Medicines to relieve congestion (decongestants). °Medicines that treat allergies (antihistamines). °Nasal sprays or ear drops that contain medicines that reduce swelling (steroids). °A procedure to drain the fluid in your eardrum. In this procedure, a small tube may be placed in the eardrum to: °Drain the fluid. °Restore the air in the middle ear space. °A procedure to insert a balloon device through the nose to inflate the opening of the eustachian tube (balloon dilation). °Follow these instructions at home: °Lifestyle °Do not do any of the following until your health care provider approves: °Travel to high altitudes. °Fly in airplanes. °Work in a pressurized cabin or room. °Scuba dive. °Do not use any products that contain nicotine or tobacco. These products include cigarettes, chewing tobacco, and vaping devices, such as e-cigarettes. If you need help quitting, ask your health care provider. °Keep your ears dry. Wear fitted earplugs during showering and bathing. Dry your ears completely after. °General instructions °Take over-the-counter and prescription medicines only as told by your health care provider. °Use techniques to help pop your ears as recommended by your health care provider. These may include: °Chewing gum. °Yawning. °Frequent, forceful swallowing. °Closing   your mouth, holding your nose closed, and gently blowing as if you are trying to blow air out of your nose. °Keep all follow-up visits. This is important. °Contact a  health care provider if: °Your symptoms do not go away after treatment. °Your symptoms come back after treatment. °You are unable to pop your ears. °You have: °A fever. °Pain in your ear. °Pain in your head or neck. °Fluid draining from your ear. °Your hearing suddenly changes. °You become very dizzy. °You lose your balance. °Get help right away if: °You have a sudden, severe increase in any of your symptoms. °Summary °Eustachian tube dysfunction refers to a condition in which a blockage develops in the eustachian tube. °It can be caused by ear infections, allergies, inhaled irritants, or abnormal growths in the nose or throat. °Symptoms may include ear pain or fullness, hearing loss, or ringing in the ears. °Mild cases are treated with techniques to unblock the ears, such as yawning or chewing gum. °More severe cases are treated with medicines or procedures. °This information is not intended to replace advice given to you by your health care provider. Make sure you discuss any questions you have with your health care provider. °Document Revised: 06/03/2020 Document Reviewed: 06/03/2020 °Elsevier Patient Education © 2022 Elsevier Inc. ° °

## 2021-03-18 NOTE — Progress Notes (Signed)
° °  Subjective:    Patient ID: Monique [name], female    DOB: [birth year], 35 y.o.   MRN: [id number]  HPI 35 yo female in non acuate distress. Has a  2 week history of ear pain bilateral, L>R. She woke up with pain 2 weeks ago with heart beat in left ear a lot of pain in her left ear. .    Blood pressure 112/80, pulse 72, temperature 98.7 F (37.1 C), temperature source Tympanic, resp. rate 16, weight 141 lb 3.2 oz (64 kg), last menstrual period 02/22/2021, SpO2 99 %. Allergies  Allergen Reactions   No Known Allergies      Review of Systems  HENT:  Positive for ear pain and postnasal drip. Negative for congestion, ear discharge (left ear clear), rhinorrhea and sore throat.   Respiratory:  Negative for cough, shortness of breath and wheezing.   Cardiovascular:  Negative for chest pain.  Gastrointestinal:  Negative for abdominal pain, diarrhea, nausea and vomiting.  Genitourinary:  Negative for difficulty urinating.  Musculoskeletal:  Negative for myalgias.  Skin:  Negative for color change and rash.  Allergic/Immunologic: Positive for environmental allergies. Negative for food allergies.  Neurological:  Positive for dizziness (mild with bending over). Negative for headaches.      Objective:   Physical Exam Vitals and nursing note reviewed.  Constitutional:      Appearance: Normal appearance.  HENT:     Head: Normocephalic and atraumatic.     Right Ear: Ear canal and external ear normal.     Left Ear: Ear canal and external ear normal.     Mouth/Throat:     Mouth: Mucous membranes are moist.     Pharynx: Oropharynx is clear.  Eyes:     Extraocular Movements: Extraocular movements intact.     Conjunctiva/sclera: Conjunctivae normal.     Pupils: Pupils are equal, round, and reactive to light.  Cardiovascular:     Rate and Rhythm: Normal rate and regular rhythm.     Heart sounds: Normal heart sounds. No murmur heard.   No friction rub. No gallop.  Pulmonary:     Effort:  Pulmonary effort is normal.     Breath sounds: Normal breath sounds.  Musculoskeletal:        General: Normal range of motion.     Cervical back: Normal range of motion and neck supple.  Lymphadenopathy:     Cervical: No cervical adenopathy.  Skin:    General: Skin is warm and dry.  Neurological:     General: No focal deficit present.     Mental Status: She is alert and oriented to person, place, and time.  Psychiatric:        Mood and Affect: Mood normal.        Behavior: Behavior normal.        Thought Content: Thought content normal.        Judgment: Judgment normal.          Assessment & Plan:  Viral infection Eustachian tube dysfunction bilateral Meds ordered this encounter  Medications   predniSONE (STERAPRED UNI-PAK 21 TAB) 10 MG (21) TBPK tablet    Sig: Take 6 tablets by mouth today then five tablets tomorrow then one less tablet every day thereafter.take with food    Dispense:  21 tablet    Refill:  0   Return in 3-5 days if not improving. Patient verbalizies understanding and has no questions at discharge.

## 2022-11-01 ENCOUNTER — Encounter (HOSPITAL_BASED_OUTPATIENT_CLINIC_OR_DEPARTMENT_OTHER): Payer: Self-pay

## 2022-11-01 ENCOUNTER — Encounter (HOSPITAL_COMMUNITY)
Admission: EM | Disposition: A | Discharge status: Home / Self Care | Payer: Self-pay | Source: Home / Self Care | Attending: Emergency Medicine

## 2022-11-01 ENCOUNTER — Emergency Department (HOSPITAL_COMMUNITY): Payer: BC Managed Care – PPO | Admitting: Certified Registered Nurse Anesthetist

## 2022-11-01 ENCOUNTER — Emergency Department (HOSPITAL_BASED_OUTPATIENT_CLINIC_OR_DEPARTMENT_OTHER): Payer: BC Managed Care – PPO

## 2022-11-01 ENCOUNTER — Inpatient Hospital Stay: Admit: 2022-11-01 | Payer: BC Managed Care – PPO | Admitting: Surgery

## 2022-11-01 ENCOUNTER — Ambulatory Visit (HOSPITAL_BASED_OUTPATIENT_CLINIC_OR_DEPARTMENT_OTHER)
Admission: EM | Admit: 2022-11-01 | Discharge: 2022-11-01 | Disposition: A | Discharge status: Home / Self Care | Payer: BC Managed Care – PPO | Attending: Emergency Medicine | Admitting: Emergency Medicine

## 2022-11-01 ENCOUNTER — Other Ambulatory Visit: Payer: Self-pay

## 2022-11-01 DIAGNOSIS — K610 Anal abscess: Secondary | ICD-10-CM | POA: Diagnosis not present

## 2022-11-01 DIAGNOSIS — K644 Residual hemorrhoidal skin tags: Secondary | ICD-10-CM | POA: Insufficient documentation

## 2022-11-01 HISTORY — DX: Anemia, unspecified: D64.9

## 2022-11-01 HISTORY — PX: INCISION AND DRAINAGE PERIRECTAL ABSCESS: SHX1804

## 2022-11-01 LAB — URINALYSIS, ROUTINE W REFLEX MICROSCOPIC
Bilirubin Urine: NEGATIVE
Glucose, UA: NEGATIVE mg/dL
Ketones, ur: 40 mg/dL — AB
Nitrite: NEGATIVE
Specific Gravity, Urine: 1.022 (ref 1.005–1.030)
pH: 6 (ref 5.0–8.0)

## 2022-11-01 LAB — CBC WITH DIFFERENTIAL/PLATELET
Abs Immature Granulocytes: 0.04 10*3/uL (ref 0.00–0.07)
Basophils Absolute: 0.1 10*3/uL (ref 0.0–0.1)
Basophils Relative: 1 %
Eosinophils Absolute: 0.1 10*3/uL (ref 0.0–0.5)
Eosinophils Relative: 1 %
HCT: 41.1 % (ref 36.0–46.0)
Hemoglobin: 14.1 g/dL (ref 12.0–15.0)
Immature Granulocytes: 0 %
Lymphocytes Relative: 13 %
Lymphs Abs: 1.5 10*3/uL (ref 0.7–4.0)
MCH: 30.3 pg (ref 26.0–34.0)
MCHC: 34.3 g/dL (ref 30.0–36.0)
MCV: 88.2 fL (ref 80.0–100.0)
Monocytes Absolute: 1.2 10*3/uL — ABNORMAL HIGH (ref 0.1–1.0)
Monocytes Relative: 10 %
Neutro Abs: 9 10*3/uL — ABNORMAL HIGH (ref 1.7–7.7)
Neutrophils Relative %: 75 %
Platelets: 240 10*3/uL (ref 150–400)
RBC: 4.66 MIL/uL (ref 3.87–5.11)
RDW: 11.9 % (ref 11.5–15.5)
WBC: 11.9 10*3/uL — ABNORMAL HIGH (ref 4.0–10.5)
nRBC: 0 % (ref 0.0–0.2)

## 2022-11-01 LAB — COMPREHENSIVE METABOLIC PANEL
ALT: 15 U/L (ref 0–44)
AST: 32 U/L (ref 15–41)
Albumin: 4.8 g/dL (ref 3.5–5.0)
Alkaline Phosphatase: 60 U/L (ref 38–126)
Anion gap: 9 (ref 5–15)
BUN: 12 mg/dL (ref 6–20)
CO2: 26 mmol/L (ref 22–32)
Calcium: 9.6 mg/dL (ref 8.9–10.3)
Chloride: 101 mmol/L (ref 98–111)
Creatinine, Ser: 0.66 mg/dL (ref 0.44–1.00)
GFR, Estimated: >60 mL/min (ref >60)
Glucose, Bld: 84 mg/dL (ref 70–99)
Potassium: 4.2 mmol/L (ref 3.5–5.1)
Sodium: 136 mmol/L (ref 135–145)
Total Bilirubin: 0.8 mg/dL (ref 0.3–1.2)
Total Protein: 8 g/dL (ref 6.5–8.1)

## 2022-11-01 LAB — LACTIC ACID, PLASMA: Lactic Acid, Venous: 0.6 mmol/L (ref 0.5–1.9)

## 2022-11-01 LAB — HCG, SERUM, QUALITATIVE: Preg, Serum: NEGATIVE

## 2022-11-01 LAB — AEROBIC/ANAEROBIC CULTURE W GRAM STAIN (SURGICAL/DEEP WOUND)

## 2022-11-01 SURGERY — INCISION AND DRAINAGE, ABSCESS, PERIRECTAL
Anesthesia: General

## 2022-11-01 MED ORDER — KETOROLAC TROMETHAMINE 30 MG/ML IJ SOLN: 30.0000 mg | Freq: Once | INTRAMUSCULAR | Status: DC | PRN

## 2022-11-01 MED ORDER — CHLORHEXIDINE GLUCONATE CLOTH 2 % EX PADS
6.0000 | MEDICATED_PAD | Freq: Once | CUTANEOUS | Status: AC
  Administered 2022-11-01: 6 via TOPICAL

## 2022-11-01 MED ORDER — CHLORHEXIDINE GLUCONATE 0.12 % MT SOLN
OROMUCOSAL | Status: AC
  Administered 2022-11-01: 15 mL via OROMUCOSAL
  Filled 2022-11-01: qty 15

## 2022-11-01 MED ORDER — CHLORHEXIDINE GLUCONATE 0.12 % MT SOLN: 15.0000 mL | Freq: Once | OROMUCOSAL | Status: AC

## 2022-11-01 MED ORDER — DIPHENHYDRAMINE HCL 50 MG/ML IJ SOLN
25.0000 mg | Freq: Once | INTRAMUSCULAR | Status: AC
  Administered 2022-11-01: 25 mg via INTRAVENOUS
  Filled 2022-11-01: qty 1

## 2022-11-01 MED ORDER — FENTANYL CITRATE (PF) 250 MCG/5ML IJ SOLN
INTRAMUSCULAR | Status: DC | PRN
  Administered 2022-11-01: 25 ug via INTRAVENOUS
  Administered 2022-11-01 (×2): 50 ug via INTRAVENOUS
  Administered 2022-11-01: 25 ug via INTRAVENOUS

## 2022-11-01 MED ORDER — SODIUM CHLORIDE 0.9 % IV SOLN: Freq: Once | INTRAVENOUS | Status: AC

## 2022-11-01 MED ORDER — SODIUM CHLORIDE 0.9 % IV SOLN
2.0000 g | Freq: Once | INTRAVENOUS | Status: AC
  Administered 2022-11-01: 2 g via INTRAVENOUS
  Filled 2022-11-01: qty 20

## 2022-11-01 MED ORDER — LACTATED RINGERS IV SOLN: INTRAVENOUS | Status: DC

## 2022-11-01 MED ORDER — SODIUM CHLORIDE 0.9 % IV BOLUS
1000.0000 mL | Freq: Once | INTRAVENOUS | Status: AC
  Administered 2022-11-01: 1000 mL via INTRAVENOUS

## 2022-11-01 MED ORDER — FAMOTIDINE IN NACL 20-0.9 MG/50ML-% IV SOLN
20.0000 mg | Freq: Once | INTRAVENOUS | Status: AC
  Administered 2022-11-01: 20 mg via INTRAVENOUS
  Filled 2022-11-01: qty 50

## 2022-11-01 MED ORDER — OXYCODONE HCL 5 MG PO TABS: 5.0000 mg | ORAL_TABLET | Freq: Once | ORAL | Status: DC | PRN

## 2022-11-01 MED ORDER — SODIUM CHLORIDE 0.9 % IV SOLN: INTRAVENOUS | Status: DC | PRN

## 2022-11-01 MED ORDER — ONDANSETRON 4 MG PO TBDP: 4.0000 mg | ORAL_TABLET | Freq: Three times a day (TID) | ORAL | 0 refills | Status: DC | PRN

## 2022-11-01 MED ORDER — ONDANSETRON HCL 4 MG/2ML IJ SOLN
4.0000 mg | Freq: Once | INTRAMUSCULAR | Status: AC
  Administered 2022-11-01: 4 mg via INTRAVENOUS
  Filled 2022-11-01: qty 2

## 2022-11-01 MED ORDER — HYDROMORPHONE HCL 1 MG/ML IJ SOLN
1.0000 mg | Freq: Once | INTRAMUSCULAR | Status: AC
  Administered 2022-11-01: 1 mg via INTRAVENOUS
  Filled 2022-11-01: qty 1

## 2022-11-01 MED ORDER — METRONIDAZOLE 500 MG/100ML IV SOLN
500.0000 mg | Freq: Once | INTRAVENOUS | Status: DC
  Filled 2022-11-01: qty 100

## 2022-11-01 MED ORDER — ORAL CARE MOUTH RINSE: 15.0000 mL | Freq: Once | OROMUCOSAL | Status: AC

## 2022-11-01 MED ORDER — OXYCODONE HCL 5 MG/5ML PO SOLN: 5.0000 mg | Freq: Once | ORAL | Status: DC | PRN

## 2022-11-01 MED ORDER — PROPOFOL 10 MG/ML IV BOLUS
INTRAVENOUS | Status: DC | PRN
Start: 2022-11-01 — End: 2022-11-01
  Administered 2022-11-01: 30 ug/kg/min via INTRAVENOUS
  Administered 2022-11-01: 200 mg via INTRAVENOUS

## 2022-11-01 MED ORDER — ACETAMINOPHEN 10 MG/ML IV SOLN: 1000.0000 mg | Freq: Once | INTRAVENOUS | Status: DC | PRN

## 2022-11-01 MED ORDER — FENTANYL CITRATE (PF) 100 MCG/2ML IJ SOLN: 25.0000 ug | INTRAMUSCULAR | Status: DC | PRN

## 2022-11-01 MED ORDER — MIDAZOLAM HCL 2 MG/2ML IJ SOLN
INTRAMUSCULAR | Status: DC | PRN
  Administered 2022-11-01: 2 mg via INTRAVENOUS

## 2022-11-01 MED ORDER — 0.9 % SODIUM CHLORIDE (POUR BTL) OPTIME
TOPICAL | Status: DC | PRN
  Administered 2022-11-01: 100 mL

## 2022-11-01 MED ORDER — LIDOCAINE 2% (20 MG/ML) 5 ML SYRINGE
INTRAMUSCULAR | Status: DC | PRN
  Administered 2022-11-01: 60 mg via INTRAVENOUS

## 2022-11-01 MED ORDER — IOHEXOL 300 MG/ML  SOLN
80.0000 mL | Freq: Once | INTRAMUSCULAR | Status: AC | PRN
  Administered 2022-11-01: 80 mL via INTRAVENOUS

## 2022-11-01 MED ORDER — EPINEPHRINE 0.3 MG/0.3ML IJ SOAJ
INTRAMUSCULAR | Status: AC
  Filled 2022-11-01: qty 0.3

## 2022-11-01 MED ORDER — MIDAZOLAM HCL 2 MG/2ML IJ SOLN
INTRAMUSCULAR | Status: AC
  Filled 2022-11-01: qty 2

## 2022-11-01 MED ORDER — PROMETHAZINE HCL 25 MG/ML IJ SOLN: 6.2500 mg | INTRAMUSCULAR | Status: DC | PRN

## 2022-11-01 MED ORDER — AMISULPRIDE (ANTIEMETIC) 5 MG/2ML IV SOLN: 10.0000 mg | Freq: Once | INTRAVENOUS | Status: DC | PRN

## 2022-11-01 MED ORDER — FENTANYL CITRATE (PF) 250 MCG/5ML IJ SOLN
INTRAMUSCULAR | Status: AC
  Filled 2022-11-01: qty 5

## 2022-11-01 MED ORDER — METHYLPREDNISOLONE SODIUM SUCC 125 MG IJ SOLR
125.0000 mg | Freq: Once | INTRAMUSCULAR | Status: AC
  Administered 2022-11-01: 125 mg via INTRAVENOUS
  Filled 2022-11-01: qty 2

## 2022-11-01 MED ORDER — PIPERACILLIN-TAZOBACTAM 3.375 G IVPB 30 MIN: 3.3750 g | Freq: Once | INTRAVENOUS | Status: DC

## 2022-11-01 MED ORDER — ONDANSETRON HCL 4 MG/2ML IJ SOLN
INTRAMUSCULAR | Status: DC | PRN
  Administered 2022-11-01: 4 mg via INTRAVENOUS

## 2022-11-01 SURGICAL SUPPLY — 24 items
BAG COUNTER SPONGE SURGICOUNT (BAG) ×2 IMPLANT
BAG SPNG CNTER NS LX DISP (BAG) ×1
BLADE SURG 11 STRL SS (BLADE) ×2 IMPLANT
COVER SURGICAL LIGHT HANDLE (MISCELLANEOUS) ×2 IMPLANT
ELECT CAUTERY BLADE 6.4 (BLADE) ×2 IMPLANT
ELECT REM PT RETURN 9FT ADLT (ELECTROSURGICAL) ×1
ELECTRODE REM PT RTRN 9FT ADLT (ELECTROSURGICAL) ×2 IMPLANT
GAUZE SPONGE 4X4 12PLY STRL (GAUZE/BANDAGES/DRESSINGS) IMPLANT
GLOVE BIOGEL PI IND STRL 6 (GLOVE) ×2 IMPLANT
GLOVE BIOGEL PI MICRO STRL 5.5 (GLOVE) ×2 IMPLANT
GOWN STRL REUS W/ TWL LRG LVL3 (GOWN DISPOSABLE) ×4 IMPLANT
GOWN STRL REUS W/TWL LRG LVL3 (GOWN DISPOSABLE) ×2
KIT BASIN OR (CUSTOM PROCEDURE TRAY) ×2 IMPLANT
KIT TURNOVER KIT B (KITS) ×2 IMPLANT
NS IRRIG 1000ML POUR BTL (IV SOLUTION) ×2 IMPLANT
PACK GENERAL/GYN (CUSTOM PROCEDURE TRAY) IMPLANT
PAD ABD 8X10 STRL (GAUZE/BANDAGES/DRESSINGS) IMPLANT
PAD ARMBOARD 7.5X6 YLW CONV (MISCELLANEOUS) ×2 IMPLANT
SURGILUBE 2OZ TUBE FLIPTOP (MISCELLANEOUS) ×2 IMPLANT
SWAB COLLECTION DEVICE MRSA (MISCELLANEOUS) IMPLANT
SWAB CULTURE ESWAB REG 1ML (MISCELLANEOUS) IMPLANT
TOWEL GREEN STERILE (TOWEL DISPOSABLE) ×2 IMPLANT
TUBE CONNECTING 12X1/4 (SUCTIONS) ×2 IMPLANT
YANKAUER SUCT BULB TIP NO VENT (SUCTIONS) ×2 IMPLANT

## 2022-11-01 NOTE — Transfer of Care (Signed)
Immediate Anesthesia Transfer of Care Note  Patient: Monique [name]  Procedure(s) Performed: IRRIGATION AND DEBRIDEMENT PERIRECTAL ABSCESS  Patient Location: PACU  Anesthesia Type:General  Level of Consciousness: sedated  Airway & Oxygen Therapy: Patient Spontanous Breathing and Patient connected to face mask oxygen  Post-op Assessment: Report given to RN and Post -op Vital signs reviewed and stable  Post vital signs: Reviewed and stable  Last Vitals:  Vitals Value Taken Time  BP 94/52 11/01/22 1618  Temp    Pulse 76 11/01/22 1619  Resp 24 11/01/22 1619  SpO2 100 % 11/01/22 1619  Vitals shown include unfiled device data.  Last Pain:  Vitals:   11/01/22 1441  TempSrc: Oral  PainSc: 6       Patients Stated Pain Goal: 3 (11/01/22 1441)  Complications: No notable events documented.

## 2022-11-01 NOTE — Anesthesia Preprocedure Evaluation (Addendum)
Anesthesia Evaluation  Patient identified by MRN, date of birth, ID band Patient awake    Reviewed: Allergy & Precautions, NPO status , Patient's Chart, lab work & pertinent test results  Airway Mallampati: I  TM Distance: >3 FB Neck ROM: Full    Dental no notable dental hx.    Pulmonary neg pulmonary ROS   Pulmonary exam normal        Cardiovascular negative cardio ROS Normal cardiovascular exam     Neuro/Psych negative neurological ROS  negative psych ROS   GI/Hepatic negative GI ROS, Neg liver ROS,,,  Endo/Other  negative endocrine ROS    Renal/GU negative Renal ROS     Musculoskeletal negative musculoskeletal ROS (+)    Abdominal   Peds  Hematology negative hematology ROS (+)   Anesthesia Other Findings Perirectal Abscess  Reproductive/Obstetrics Hcg negative                             Anesthesia Physical Anesthesia Plan  ASA: 1 and emergent  Anesthesia Plan: General   Post-op Pain Management:    Induction: Intravenous  PONV Risk Score and Plan: 3 and Ondansetron, Dexamethasone, Midazolam and Treatment may vary due to age or medical condition  Airway Management Planned: LMA  Additional Equipment:   Intra-op Plan:   Post-operative Plan: Extubation in OR  Informed Consent: I have reviewed the patients History and Physical, chart, labs and discussed the procedure including the risks, benefits and alternatives for the proposed anesthesia with the patient or authorized representative who has indicated his/her understanding and acceptance.     Dental advisory given  Plan Discussed with: CRNA  Anesthesia Plan Comments:         Anesthesia Quick Evaluation

## 2022-11-01 NOTE — ED Triage Notes (Signed)
She states she has known hemorrhoids "ever since I was pregnant". She states they normally are no problematic. She is here today with c/o increasing discomfort of her hemorrhoids, which is not improving despite her using steroid topical. She denies any hemorrhoidal bleeding and tells me she is having light period bleeding.

## 2022-11-01 NOTE — Anesthesia Postprocedure Evaluation (Signed)
Anesthesia Post Note  Patient: Monique [name]  Procedure(s) Performed: IRRIGATION AND DEBRIDEMENT PERIRECTAL ABSCESS     Patient location during evaluation: PACU Anesthesia Type: General Level of consciousness: awake Pain management: pain level controlled Vital Signs Assessment: post-procedure vital signs reviewed and stable Respiratory status: spontaneous breathing, nonlabored ventilation and respiratory function stable Cardiovascular status: blood pressure returned to baseline and stable Postop Assessment: no apparent nausea or vomiting Anesthetic complications: no   No notable events documented.  Last Vitals:  Vitals:   11/01/22 1645 11/01/22 1700  BP: 111/68 121/72  Pulse: 89 92  Resp: 13 15  Temp:  36.6 C  SpO2: 100% 100%    Last Pain:  Vitals:   11/01/22 1700  TempSrc:   PainSc: 0-No pain                 Reah Justo P Nicola Heinemann

## 2022-11-01 NOTE — Op Note (Signed)
Date: 11/01/22  Patient: Monique [name] MRN: [id number]  Preoperative Diagnosis: Perianal abscess Postoperative Diagnosis: Same  Procedure: Incision and drainage of perianal abscess  Surgeon: Sophronia Simas, MD  EBL: Minimal  Anesthesia: General LMA  Specimens: Perianal abscess for culture only  Indications: [name] is a 37 yo female who presented to the ED with worsening perianal pain and pressure. A CT scan showed a perianal abscess. She was brought to the OR for exam under anesthesia and incision and drainage.  Findings: Left anterolateral perianal abscess. Non-thrombosed external hemorrhoids.  Procedure details: Informed consent was obtained in the preoperative area prior to the procedure. The patient was brought to the operating room, general anesthesia was induced, and the patient was placed in the lithotomy position. The patient was prepped and draped in the usual sterile fashion. A pre-procedure timeout was taken verifying patient identity, surgical site and procedure to be performed.  There was a fluctuant mass in the left anterolateral position to the anal verge, between the anal verge and vaginal orifice. There was no overlying cellulitis. There were non-thrombosed external hemorrhoids. A digital rectal exam was performed and there were no luminal masses, however the fluctuant mass was palpable adjacent to the anal canal. The perianal skin overlying the fluctuant area was incised with an 11-blade scalpel and purulent drainage was expressed. The wound was swabbed for cultures. The abscess cavity was copiously irrigated with saline. A hemostat was used to gently probe the cavity, which tracked medially toward the rectum but did not seem to communicate with the rectum. Hemostasis was achieved at the wound edges with cautery and manual pressure. A clean gauze dressing was applied.  The patient tolerated the procedure well with no apparent complications. All counts were correct x2 at  the end of the procedure. The patient was extubated and taken to PACU in stable condition.  Sophronia Simas, MD 11/01/22 4:16 PM

## 2022-11-01 NOTE — Anesthesia Procedure Notes (Signed)
Procedure Name: LMA Insertion Date/Time: 11/01/2022 3:46 PM  Performed by: Loleta Kalin Amrhein, CRNAPre-anesthesia Checklist: Patient identified, Patient being monitored, Timeout performed, Emergency Drugs available and Suction available Patient Re-evaluated:Patient Re-evaluated prior to induction Oxygen Delivery Method: Circle system utilized Preoxygenation: Pre-oxygenation with 100% oxygen Induction Type: IV induction Ventilation: Mask ventilation without difficulty LMA: LMA inserted LMA Size: 4.0 Tube type: Oral Number of attempts: 1 Placement Confirmation: positive ETCO2 and breath sounds checked- equal and bilateral Tube secured with: Tape Dental Injury: Teeth and Oropharynx as per pre-operative assessment

## 2022-11-01 NOTE — H&P (Signed)
ADER RIEKER 1986/01/03  [id number].     HPI:  Ms. Monique [name] is a 37 yo female who presented to the [facility] ED today with worsening perianal pain. She has had symptomatic external hemorrhoids in the past, for which she takes fiber. Over the last few days she developed what she initially thought was a hemorrhoid, but her pain became very severe. She has significant perianal pressure and pain, particularly with bowel movements. She has not had fevers or chills. A CT scan in the ED showed a perianal abscess. She was transferred to [facility].  She is otherwise healthy and has not had any prior surgeries.  ROS: Review of Systems  Constitutional:  Negative for chills and fever.  Respiratory:  Negative for shortness of breath.   Cardiovascular:  Negative for chest pain.  Gastrointestinal:  Negative for abdominal pain, nausea and vomiting.    History reviewed. No pertinent family history.  Past Medical History:  Diagnosis Date   Anemia     Past Surgical History:  Procedure Laterality Date   NO PAST SURGERIES      Social History:  reports that she has never smoked. She has never used smokeless tobacco. She reports that she does not drink alcohol and does not use drugs.  Allergies:  Allergies  Allergen Reactions   Rocephin [Ceftriaxone] Shortness Of Breath    After IV administration.   No Known Allergies     Medications Prior to Admission  Medication Sig Dispense Refill   amoxicillin-clavulanate (AUGMENTIN) 875-125 MG tablet Take 1 tablet by mouth 2 (two) times daily. 20 tablet 0   APPLE CIDER VINEGAR PO Take by mouth daily.     chlorpheniramine-HYDROcodone (TUSSIONEX PENNKINETIC ER) 10-8 MG/5ML SUER Take 5 mLs by mouth at bedtime as needed for cough. 60 mL 0   COLLAGEN PO Take by mouth daily.     Multiple Vitamins-Minerals (WOMENS MULTIVITAMIN PO) Take by mouth daily.     Norethin Ace-Eth Estrad-FE (MINASTRIN 24 FE) 1-20 MG-MCG(24) CHEW Chew 1 mg by mouth daily. (Patient not taking:  Reported on 03/24/2018) 28 tablet 11   norethindrone (MICRONOR,CAMILA,ERRIN) 0.35 MG tablet Take 1 tablet (0.35 mg total) by mouth daily. 1 Package 6   norgestimate-ethinyl estradiol (SPRINTEC 28) 0.25-35 MG-MCG tablet Take 1 tablet by mouth daily. (Patient not taking: Reported on 03/24/2018) 1 Package 5   ondansetron (ZOFRAN-ODT) 4 MG disintegrating tablet Take 1 tablet (4 mg total) every 8 (eight) hours as needed by mouth for nausea or vomiting. (Patient not taking: Reported on 05/19/2017) 10 tablet 0   predniSONE (STERAPRED UNI-PAK 21 TAB) 10 MG (21) TBPK tablet Take 6 tablets by mouth today then five tablets tomorrow then one less tablet every day thereafter.take with food 21 tablet 0   Probiotic Product (PROBIOTIC DAILY PO) Take by mouth daily.       Physical Exam: Blood pressure 131/78, pulse (!) 106, temperature 98.7 F (37.1 C), temperature source Oral, resp. rate 16, height 5\' 5"  (1.651 m), weight 65.8 kg, last menstrual period 10/26/2022, SpO2 98%. General: resting comfortably, appears stated age, no apparent distress Neurological: alert and oriented, no focal deficits, cranial nerves grossly in tact HEENT: normocephalic, atraumatic Respiratory: normal work of breathing on room air Anorectal: area of fluctuance on the perianal skin in the left anterolateral position, no spontaneous drainage. Extremities: warm and well-perfused, no deformities, moving all extremities spontaneously Psychiatric: normal mood and affect Skin: warm and dry, no jaundice, no rashes or lesions   Results for orders placed  or performed during the hospital encounter of 11/01/22 (from the past 48 hour(s))  Comprehensive metabolic panel     Status: None   Collection Time: 11/01/22  8:28 AM  Result Value Ref Range   Sodium 136 135 - 145 mmol/L   Potassium 4.2 3.5 - 5.1 mmol/L   Chloride 101 98 - 111 mmol/L   CO2 26 22 - 32 mmol/L   Glucose, Bld 84 70 - 99 mg/dL    Comment: Glucose reference range applies  only to samples taken after fasting for at least 8 hours.   BUN 12 6 - 20 mg/dL   Creatinine, Ser 4.33 0.44 - 1.00 mg/dL   Calcium 9.6 8.9 - 29.5 mg/dL   Total Protein 8.0 6.5 - 8.1 g/dL   Albumin 4.8 3.5 - 5.0 g/dL   AST 32 15 - 41 U/L   ALT 15 0 - 44 U/L   Alkaline Phosphatase 60 38 - 126 U/L   Total Bilirubin 0.8 0.3 - 1.2 mg/dL   GFR, Estimated >18 >84 mL/min    Comment: (NOTE) Calculated using the CKD-EPI Creatinine Equation (2021)    Anion gap 9 5 - 15    Comment: Performed at [facility], [address] [place], [place] [address]  CBC with Differential     Status: Abnormal   Collection Time: 11/01/22  8:28 AM  Result Value Ref Range   WBC 11.9 (H) 4.0 - 10.5 K/uL   RBC 4.66 3.87 - 5.11 MIL/uL   Hemoglobin 14.1 12.0 - 15.0 g/dL   HCT 30.1 60.1 - 09.3 %   MCV 88.2 80.0 - 100.0 fL   MCH 30.3 26.0 - 34.0 pg   MCHC 34.3 30.0 - 36.0 g/dL   RDW 23.5 57.3 - 22.0 %   Platelets 240 150 - 400 K/uL   nRBC 0.0 0.0 - 0.2 %   Neutrophils Relative % 75 %   Neutro Abs 9.0 (H) 1.7 - 7.7 K/uL   Lymphocytes Relative 13 %   Lymphs Abs 1.5 0.7 - 4.0 K/uL   Monocytes Relative 10 %   Monocytes Absolute 1.2 (H) 0.1 - 1.0 K/uL   Eosinophils Relative 1 %   Eosinophils Absolute 0.1 0.0 - 0.5 K/uL   Basophils Relative 1 %   Basophils Absolute 0.1 0.0 - 0.1 K/uL   Immature Granulocytes 0 %   Abs Immature Granulocytes 0.04 0.00 - 0.07 K/uL    Comment: Performed at [facility], [address], [place], [place] [address]  Lactic acid, plasma     Status: None   Collection Time: 11/01/22  8:28 AM  Result Value Ref Range   Lactic Acid, Venous 0.6 0.5 - 1.9 mmol/L    Comment: Performed at [facility], [address], [place], [place] [address]  Pregnancy serum, qualitative     Status: None   Collection Time: 11/01/22  8:28 AM  Result Value Ref Range   Preg, Serum NEGATIVE NEGATIVE    Comment:        THE SENSITIVITY OF  THIS METHODOLOGY IS >10 mIU/mL. Performed at [facility], [address] [place], [place] [address]   Urinalysis, Routine w reflex microscopic -Urine, Clean Catch     Status: Abnormal   Collection Time: 11/01/22  8:28 AM  Result Value Ref Range   Color, Urine YELLOW YELLOW   APPearance CLEAR CLEAR   Specific Gravity, Urine 1.022 1.005 - 1.030   pH 6.0 5.0 - 8.0   Glucose,  UA NEGATIVE NEGATIVE mg/dL   Hgb urine dipstick LARGE (A) NEGATIVE   Bilirubin Urine NEGATIVE NEGATIVE   Ketones, ur 40 (A) NEGATIVE mg/dL   Protein, ur TRACE (A) NEGATIVE mg/dL   Nitrite NEGATIVE NEGATIVE   Leukocytes,Ua TRACE (A) NEGATIVE   RBC / HPF 21-50 0 - 5 RBC/hpf   WBC, UA 6-10 0 - 5 WBC/hpf   Bacteria, UA RARE (A) NONE SEEN   Squamous Epithelial / HPF 0-5 0 - 5 /HPF   Mucus PRESENT     Comment: Performed at [facility], [address], [place], [place] [address]   CT ABDOMEN PELVIS W CONTRAST  Result Date: 11/01/2022 CLINICAL DATA:  Hemorrhoids with increasing pain. Evaluate for deep space infection. EXAM: CT ABDOMEN AND PELVIS WITH CONTRAST TECHNIQUE: Multidetector CT imaging of the abdomen and pelvis was performed using the standard protocol following bolus administration of intravenous contrast. RADIATION DOSE REDUCTION: This exam was performed according to the departmental dose-optimization program which includes automated exposure control, adjustment of the mA and/or kV according to patient size and/or use of iterative reconstruction technique. CONTRAST:  80mL OMNIPAQUE IOHEXOL 300 MG/ML  SOLN COMPARISON:  Prior CT scan of the abdomen and pelvis 02/11/2017 FINDINGS: Lower chest: No acute abnormality. Hepatobiliary: No focal liver abnormality is seen. No gallstones, gallbladder wall thickening, or biliary dilatation. Pancreas: Unremarkable. No pancreatic ductal dilatation or surrounding inflammatory changes. Spleen: Normal in size without focal abnormality.  Adrenals/Urinary Tract: Adrenal glands are unremarkable. Kidneys are normal, without renal calculi, focal lesion, or hydronephrosis. Bladder is unremarkable. Stomach/Bowel: Stomach is within normal limits. Appendix appears normal. No evidence of bowel wall thickening, distention, or inflammatory changes. Vascular/Lymphatic: No significant vascular findings are present. No enlarged abdominal or pelvic lymph nodes. Reproductive: Circumscribed low-attenuation cystic collection affiliated with the left ovary measures 5.9 x 4.1 cm. Similar findings were present on prior imaging from 2018. the uterus is unremarkable. The right ovary is unremarkable. Other: Partially imaged peripherally enhancing fluid collection in the left buttock adjacent to the anus measures 2.6 x 1.8 cm. There are surrounding inflammatory changes. No extension into the ischial rectal fossa. Musculoskeletal: No acute fracture or aggressive appearing lytic or blastic osseous lesion. IMPRESSION: 1. Positive for perianal abscess in the superficial subcutaneous fat of the medial left buttock measuring approximately 2.6 x 1.8 cm. No extension into the ischial rectal fossa. 2. Similar appearance of circumscribed nearly 6 cm cyst affiliated with the left ovary compared to prior imaging from 2018. Stability over nearly 6 years suggests a benign process. Electronically Signed   By: Malachy Moan M.D.   On: 11/01/2022 10:28      Assessment/Plan 37 yo female presenting with a perianal abscess. Proceed to the OR for an exam under anesthesia and incision and drainage of abscess. I reviewed the planned procedure with the patient and informed consent was obtained. I also reviewed the anticipated postoperative recovery course and postoperative instructions. All questions were answered. She was given antibiotics in the ED, and had a reaction to Rocephin, which resolved with cessation of the infusion. Plan to discharge home postoperatively.   Sophronia Simas,  MD [facility] Surgery General, Hepatobiliary and Pancreatic Surgery 11/01/22 3:29 PM

## 2022-11-01 NOTE — ED Notes (Signed)
[facility] with cl called for transport/ Dr Rosalia Hammers accepting in ED-Dr Sophronia Simas for OR

## 2022-11-01 NOTE — ED Provider Notes (Addendum)
[place] EMERGENCY DEPARTMENT AT [facility] Provider Note   CSN: [id number] Arrival date & time: 11/01/22  0755     History  Chief Complaint  Patient presents with   Hemorrhoids    Monique [name] is a 36 y.o. female.  HPI Patient reports she is has a history of hemorrhoids which she typically can manage with sitz bath and topical steroid treatment.  She reports about a week ago she started getting rectal pain and thought she was getting a flare of hemorrhoids and has been doing usual treatment measures.  However, over the past several days pain in the rectal area, buttocks and vaginal area has become intense.  She has severe pain with any movements and pain at rest as well.  At this point only position of comfort is standing or lying on her side.  She reports she is currently having a normal menstrual cycle.  She denies any abdominal pain.  No pain burning urgency with urination.  No fever no chills.  Patient reports she is otherwise healthy.    Home Medications Prior to Admission medications   Medication Sig Start Date End Date Taking? Authorizing Provider  amoxicillin-clavulanate (AUGMENTIN) 875-125 MG tablet Take 1 tablet by mouth 2 (two) times daily. 03/24/18   Flinchum, Eula Fried, FNP  APPLE CIDER VINEGAR PO Take by mouth daily.    [provider]  chlorpheniramine-HYDROcodone (TUSSIONEX PENNKINETIC ER) 10-8 MG/5ML SUER Take 5 mLs by mouth at bedtime as needed for cough. 03/24/18   Flinchum, Eula Fried, FNP  COLLAGEN PO Take by mouth daily.    [provider]  Multiple Vitamins-Minerals (WOMENS MULTIVITAMIN PO) Take by mouth daily.    [provider]  Norethin Ace-Eth Estrad-FE (MINASTRIN 24 FE) 1-20 MG-MCG(24) CHEW Chew 1 mg by mouth daily. Patient not taking: Reported on 03/24/2018 02/08/12   Lavera Guise, CNM  norethindrone (MICRONOR,CAMILA,ERRIN) 0.35 MG tablet Take 1 tablet (0.35 mg total) by mouth daily. 10/07/11 10/06/12  Hal Morales, MD  norgestimate-ethinyl estradiol (SPRINTEC 28) 0.25-35 MG-MCG tablet Take 1 tablet by mouth daily. Patient not taking: Reported on 03/24/2018 02/10/12   Henreitta Leber, PA-C  ondansetron (ZOFRAN-ODT) 4 MG disintegrating tablet Take 1 tablet (4 mg total) every 8 (eight) hours as needed by mouth for nausea or vomiting. Patient not taking: Reported on 05/19/2017 02/10/17   Flinchum, Eula Fried, FNP  predniSONE (STERAPRED UNI-PAK 21 TAB) 10 MG (21) TBPK tablet Take 6 tablets by mouth today then five tablets tomorrow then one less tablet every day thereafter.take with food 03/18/21   Ratcliffe, Herbert Seta R, PA-C  Probiotic Product (PROBIOTIC DAILY PO) Take by mouth daily.    [provider]      Allergies    No known allergies    Review of Systems   Review of Systems  Physical Exam Updated Vital Signs BP (!) 125/90 (BP Location: Right Arm)   Pulse 75   Temp 98.2 F (36.8 C) (Oral)   Resp 19   LMP 10/26/2022 (Exact Date)   SpO2 99%  Physical Exam Constitutional:      Comments: Well-nourished well-developed.  In significant pain.  Standing in semibent over position.  HENT:     Mouth/Throat:     Pharynx: Oropharynx is clear.  Eyes:     Extraocular Movements: Extraocular movements intact.  Cardiovascular:     Rate and Rhythm: Normal rate and regular rhythm.  Pulmonary:     Effort: Pulmonary effort is normal.  Breath sounds: Normal breath sounds.  Abdominal:     General: There is no distension.     Palpations: Abdomen is soft.     Tenderness: There is no abdominal tenderness. There is no guarding.  Genitourinary:    Comments: Patient has moderately sized nonthrombosed hemorrhoidal tags on the left perianal side.  External visual inspection of the vulva and perianal area does not show erythema or cellulitis.  No vaginal drainage.  Patient has exquisite pain to palpation from the external palpation of the perianal area on the left moving forward on the perineum to  the left labia majora.  Externally no real visual changes but does feel to have some firm or fluctuant soft tissues more deeply.  Digital exam exquisitely painful for the patient.  Rectum feels thickened boggy and irregular.  No visible blood from digital exam.  Patient has small amount of vaginal blood from normal menstrual cycle. Musculoskeletal:        General: No swelling or tenderness. Normal range of motion.     Right lower leg: No edema.     Left lower leg: No edema.  Skin:    General: Skin is warm and dry.  Neurological:     General: No focal deficit present.     Mental Status: She is oriented to person, place, and time.     Coordination: Coordination normal.     ED Results / Procedures / Treatments   Labs (all labs ordered are listed, but only abnormal results are displayed) Labs Reviewed  CBC WITH DIFFERENTIAL/PLATELET - Abnormal; Notable for the following components:      Result Value   WBC 11.9 (*)    Neutro Abs 9.0 (*)    Monocytes Absolute 1.2 (*)    All other components within normal limits  URINALYSIS, ROUTINE W REFLEX MICROSCOPIC - Abnormal; Notable for the following components:   Hgb urine dipstick LARGE (*)    Ketones, ur 40 (*)    Protein, ur TRACE (*)    Leukocytes,Ua TRACE (*)    Bacteria, UA RARE (*)    All other components within normal limits  COMPREHENSIVE METABOLIC PANEL  LACTIC ACID, PLASMA  HCG, SERUM, QUALITATIVE    EKG None  Radiology CT ABDOMEN PELVIS W CONTRAST  Result Date: 11/01/2022 CLINICAL DATA:  Hemorrhoids with increasing pain. Evaluate for deep space infection. EXAM: CT ABDOMEN AND PELVIS WITH CONTRAST TECHNIQUE: Multidetector CT imaging of the abdomen and pelvis was performed using the standard protocol following bolus administration of intravenous contrast. RADIATION DOSE REDUCTION: This exam was performed according to the departmental dose-optimization program which includes automated exposure control, adjustment of the mA and/or  kV according to patient size and/or use of iterative reconstruction technique. CONTRAST:  80mL OMNIPAQUE IOHEXOL 300 MG/ML  SOLN COMPARISON:  Prior CT scan of the abdomen and pelvis 02/11/2017 FINDINGS: Lower chest: No acute abnormality. Hepatobiliary: No focal liver abnormality is seen. No gallstones, gallbladder wall thickening, or biliary dilatation. Pancreas: Unremarkable. No pancreatic ductal dilatation or surrounding inflammatory changes. Spleen: Normal in size without focal abnormality. Adrenals/Urinary Tract: Adrenal glands are unremarkable. Kidneys are normal, without renal calculi, focal lesion, or hydronephrosis. Bladder is unremarkable. Stomach/Bowel: Stomach is within normal limits. Appendix appears normal. No evidence of bowel wall thickening, distention, or inflammatory changes. Vascular/Lymphatic: No significant vascular findings are present. No enlarged abdominal or pelvic lymph nodes. Reproductive: Circumscribed low-attenuation cystic collection affiliated with the left ovary measures 5.9 x 4.1 cm. Similar findings were present on prior imaging from  2018. the uterus is unremarkable. The right ovary is unremarkable. Other: Partially imaged peripherally enhancing fluid collection in the left buttock adjacent to the anus measures 2.6 x 1.8 cm. There are surrounding inflammatory changes. No extension into the ischial rectal fossa. Musculoskeletal: No acute fracture or aggressive appearing lytic or blastic osseous lesion. IMPRESSION: 1. Positive for perianal abscess in the superficial subcutaneous fat of the medial left buttock measuring approximately 2.6 x 1.8 cm. No extension into the ischial rectal fossa. 2. Similar appearance of circumscribed nearly 6 cm cyst affiliated with the left ovary compared to prior imaging from 2018. Stability over nearly 6 years suggests a benign process. Electronically Signed   By: Malachy Moan M.D.   On: 11/01/2022 10:28    Procedures Procedures  CRITICAL  CARE Performed by: Arby Barrette   Total critical care time: 30 minutes  Critical care time was exclusive of separately billable procedures and treating other patients.  Critical care was necessary to treat or prevent imminent or life-threatening deterioration.  Critical care was time spent personally by me on the following activities: development of treatment plan with patient and/or surrogate as well as nursing, discussions with consultants, evaluation of patient's response to treatment, examination of patient, obtaining history from patient or surrogate, ordering and performing treatments and interventions, ordering and review of laboratory studies, ordering and review of radiographic studies, pulse oximetry and re-evaluation of patient's condition.   Medications Ordered in ED Medications  cefTRIAXone (ROCEPHIN) 2 g in sodium chloride 0.9 % 100 mL IVPB (has no administration in time range)  metroNIDAZOLE (FLAGYL) IVPB 500 mg (has no administration in time range)  HYDROmorphone (DILAUDID) injection 1 mg (1 mg Intravenous Given 11/01/22 0856)  0.9 %  sodium chloride infusion ( Intravenous New Bag/Given 11/01/22 0856)  ondansetron (ZOFRAN) injection 4 mg (4 mg Intravenous Given 11/01/22 0856)  iohexol (OMNIPAQUE) 300 MG/ML solution 80 mL (80 mLs Intravenous Contrast Given 11/01/22 1002)    ED Course/ Medical Decision Making/ A&P                             Medical Decision Making Amount and/or Complexity of Data Reviewed Labs: ordered. Radiology: ordered.  Risk Prescription drug management.   Patient is otherwise healthy.  She has had chronic moderate hemorrhoids which have been managed with typical treatment.  She has severe pain disproportionate to prior hemorrhoidal flares.  Physical exam is for exquisitely tender perianal and rectal soft tissues on the left side tracking toward the vulva.  There does seem to be deeper induration and I have suspicion for perineal\perirectal  abscess.  Externally, there is no real focus of fluctuance.  The external hemorrhoids are innocuous in appearance and consistent with hemorrhoidal tags.  No externally thrombosed hemorrhoids.  Will proceed with CT abdomen pelvis and lab work.  Will treat for pain with Dilaudid and Zofran for nausea.  White count 11.9.  Basic chemistry metabolic panel normal.  Pregnancy negative.  CT scan reviewed by radiology and personally reviewed by myself.  Perianal abscess.  Stable large ovarian cyst.  1040 consult reviewed with Dr. Sophronia Simas.  Will plan to drain in the OR at [facility].  Patient to stay here until OR time ready.  They will call when ready for the patient to transport to [facility] preop.  11: 50 patient experienced vasomotor response to Rocephin.  She felt sudden nasal congestion and feeling of throat tightness and difficulty swallowing  about 10 minutes into the infusion.  She felt subjectively somewhat short of breath.  There was some tingling but no visible rash develop.  Infusion stopped.  Patient given Benadryl, Solu-Medrol and Pepcid.  Recheck at 12: 10, patient feels much improved.  Symptoms are resolving.  She has not had any progression.  There is no wheezing on exam.  Airway is widely patent.  There is no visible rash or urticaria.  At this time we will complete infusions of Solu-Medrol Benadryl and Pepcid and fluids and consider starting Flagyl once all these medications have infused.  Unclear if true allergic reaction versus vasomotor response to the infusion.          Final Clinical Impression(s) / ED Diagnoses Final diagnoses:  Perianal abscess    Rx / DC Orders ED Discharge Orders     None         Arby Barrette, MD 11/01/22 1043    Arby Barrette, MD 11/01/22 1211

## 2022-11-01 NOTE — Discharge Instructions (Signed)
[facility] DISCHARGE INSTRUCTIONS  Wound Care You may keep clean gauze or a pad over your wound. Some drainage is normal as it heals. Change your dressing at least twice a day while the wound is draining, and more often as needed if saturated. Perform sitz baths 2-3 times per day and after bowel movements. This will help keep the wound clean.  Medications A  prescription for pain medication may be given to you upon discharge.  Take your pain medication as prescribed, if needed.  If narcotic pain medicine is not needed, then you may take acetaminophen (Tylenol) or ibuprofen (Advil) as needed. It is common to experience some constipation if taking pain medication after surgery.  Increasing fluid intake and taking a stool softener (such as Colace) will usually help or prevent this problem from occurring.  A mild laxative (Milk of Magnesia or Miralax) should be taken according to package directions if there are no bowel movements after 48 hours. Take your usually prescribed medications unless otherwise directed. If you need a refill on your pain medication, please contact your pharmacy.  They will contact our office to request authorization. Prescriptions will not be filled after 5 pm or on weekends.  When to Call us: Fever greater than 100.5 New redness, drainage, or swelling at incision site Severe pain, nausea, or vomiting Persistent bleeding from incisions Difficulty urinating  Follow-up You will have a postoperative appointment scheduled in 3-4 weeks - our office will contact you this week to schedule this, or you may call the number below. This will be at the [facility] Surgery office at [address], [address], [place], [place]. Please arrive at least 15 minutes prior to your scheduled appointment time.  The clinic staff is available to answer your questions during regular business hours.  Please don't hesitate to call and ask to speak to one of the nurses for  clinical concerns.  If you have a medical emergency, go to the nearest emergency room or call 911.  A surgeon from [facility] is always on call at the hospital  [address], [address], [place], [place]  [address] ?  [address] [address], [place], [place]   [address] [phone] ? Toll Free: [phone] ? FAX [phone] Web site: [email or web address]      Managing Your Pain After Surgery Without Opioids    Thank you for participating in our program to help patients manage their pain after surgery without opioids. This is part of our effort to provide you with the best care possible, without exposing you or your family to the risk that opioids pose.  What pain can I expect after surgery? You can expect to have some pain after surgery. This is normal. Many studies have found that many patients are able to manage their pain after surgery with Over-the-Counter (OTC) medications such as Tylenol and Motrin. If you have a condition that does not allow you to take Tylenol or Motrin, notify your surgical team.  How will I manage my pain? The best strategy for controlling your pain after surgery is around the clock pain control with Tylenol (acetaminophen) and Motrin (ibuprofen or Advil). Alternating these medications with each other allows you to maximize your pain control. In addition to Tylenol and Motrin, you can use heating pads or ice packs on your incisions to help reduce your pain.  How will I alternate your regular strength over-the-counter pain medication? You will take a dose of pain medication every three  hours. Start by taking 650 mg of Tylenol (2 pills of 325 mg) 3 hours later take 600 mg of Motrin (3 pills of 200 mg) 3 hours after taking the Motrin take 650 mg of Tylenol 3 hours after that take 600 mg of Motrin.   - 1 -  See example - if your first dose of Tylenol is at 12:00 PM   12:00 PM Tylenol 650 mg (2 pills of 325 mg)  3:00 PM Motrin 600 mg  (3 pills of 200 mg)  6:00 PM Tylenol 650 mg (2 pills of 325 mg)  9:00 PM Motrin 600 mg (3 pills of 200 mg)  Continue alternating every 3 hours   We recommend that you follow this schedule around-the-clock for at least 3 days after surgery, or until you feel that it is no longer needed. Use the table on the last page of this handout to keep track of the medications you are taking. Important: Do not take more than 3000mg  of Tylenol or 3200mg  of Motrin in a 24-hour period. Do not take ibuprofen/Motrin if you have a history of bleeding stomach ulcers, severe kidney disease, &/or actively taking a blood thinner  What if I still have pain? If you have pain that is not controlled with the over-the-counter pain medications (Tylenol and Motrin or Advil) you might have what we call "breakthrough" pain. You will receive a prescription for a small amount of an opioid pain medication such as Oxycodone, Tramadol, or Tylenol with Codeine. Use these opioid pills in the first 24 hours after surgery if you have breakthrough pain. Do not take more than 1 pill every 4-6 hours.  If you still have uncontrolled pain after using all opioid pills, don't hesitate to call our staff using the number provided. We will help make sure you are managing your pain in the best way possible, and if necessary, we can provide a prescription for additional pain medication.   Day 1    Time  Name of Medication Number of pills taken  Amount of Acetaminophen  Pain Level   Comments  AM PM       AM PM       AM PM       AM PM       AM PM       AM PM       AM PM       AM PM       Total Daily amount of Acetaminophen Do not take more than  3,000 mg per day      Day 2    Time  Name of Medication Number of pills taken  Amount of Acetaminophen  Pain Level   Comments  AM PM       AM PM       AM PM       AM PM       AM PM       AM PM       AM PM       AM PM       Total Daily amount of Acetaminophen Do not take more  than  3,000 mg per day      Day 3    Time  Name of Medication Number of pills taken  Amount of Acetaminophen  Pain Level   Comments  AM PM       AM PM       AM PM  AM PM         AM PM       AM PM       AM PM       AM PM       Total Daily amount of Acetaminophen Do not take more than  3,000 mg per day      Day 4    Time  Name of Medication Number of pills taken  Amount of Acetaminophen  Pain Level   Comments  AM PM       AM PM       AM PM       AM PM       AM PM       AM PM       AM PM       AM PM       Total Daily amount of Acetaminophen Do not take more than  3,000 mg per day      Day 5    Time  Name of Medication Number of pills taken  Amount of Acetaminophen  Pain Level   Comments  AM PM       AM PM       AM PM       AM PM       AM PM       AM PM       AM PM       AM PM       Total Daily amount of Acetaminophen Do not take more than  3,000 mg per day      Day 6    Time  Name of Medication Number of pills taken  Amount of Acetaminophen  Pain Level  Comments  AM PM       AM PM       AM PM       AM PM       AM PM       AM PM       AM PM       AM PM       Total Daily amount of Acetaminophen Do not take more than  3,000 mg per day      Day 7    Time  Name of Medication Number of pills taken  Amount of Acetaminophen  Pain Level   Comments  AM PM       AM PM       AM PM       AM PM       AM PM       AM PM       AM PM       AM PM       Total Daily amount of Acetaminophen Do not take more than  3,000 mg per day        For additional information about how and where to safely dispose of unused opioid medications - [employer]  Disclaimer: This document contains information and/or instructional materials adapted from [facility] for the typical patient with your condition. It does not replace medical advice from your health care provider because your experience may differ from that of  the typical patient. Talk to your health care provider if you have any questions about this document, your condition or your treatment plan. Adapted from [facility]

## 2022-11-02 ENCOUNTER — Encounter (HOSPITAL_COMMUNITY): Payer: Self-pay | Admitting: Surgery

## 2023-11-10 ENCOUNTER — Ambulatory Visit: Admitting: General Practice

## 2023-11-10 ENCOUNTER — Encounter: Payer: Self-pay | Admitting: General Practice

## 2023-11-10 VITALS — BP 120/80 | HR 80 | Temp 98.3°F | Ht 66.0 in | Wt 154.0 lb

## 2023-11-10 DIAGNOSIS — L700 Acne vulgaris: Secondary | ICD-10-CM | POA: Insufficient documentation

## 2023-11-10 DIAGNOSIS — D229 Melanocytic nevi, unspecified: Secondary | ICD-10-CM | POA: Diagnosis not present

## 2023-11-10 DIAGNOSIS — Z7689 Persons encountering health services in other specified circumstances: Secondary | ICD-10-CM

## 2023-11-10 NOTE — Assessment & Plan Note (Signed)
 EMR reviewed briefly.

## 2023-11-10 NOTE — Patient Instructions (Addendum)
 You will either be contacted via phone regarding your referral to dermatology , or you may receive a letter on your MyChart portal from our referral team with instructions for scheduling an appointment. Please let us  know if you have not been contacted by anyone within two weeks.  Physical in 2-3 weeks with fasting labs.  It was a pleasure to meet you today! Please don't hesitate to contact me with any questions. Welcome to [facility]!

## 2023-11-10 NOTE — Assessment & Plan Note (Signed)
 Chronic.  Would like to be referred to her current dermatologist.  Referral placed.

## 2023-11-10 NOTE — Progress Notes (Signed)
 New Patient Office Visit  Subjective    Patient ID: Monique [name], female    DOB: [birth year]  Age: 38 y.o. MRN: [id number]  CC:  Chief Complaint  Patient presents with   New Patient (Initial Visit)    Due for pap but does have appt coming up with gyn to have done   Referral    Due to patients insurance she needs referral to [facility] to continue to be seen.    HPI Monique [name] is a 38 y.o. female presents to establish care.  Last pcp/physical/labs: [place]: She has had multiple moles removed that were concerning. She has been following with [facility] in [place]. She changed her insurance which now requires a referral. She has one on her the let inner thigh and one on the right side of her right breast.   She is scheduled to see the midwife, Orie, at [facility] to have her pap smear.  Declines TDAP.  Is not fasting today.   Outpatient Encounter Medications as of 11/10/2023  Medication Sig   APPLE CIDER VINEGAR PO Take by mouth daily.   MAGNESIUM GLUCONATE PO Take by mouth daily.   Multiple Vitamins-Minerals (WOMENS MULTIVITAMIN PO) Take by mouth daily.   Probiotic Product (PROBIOTIC DAILY PO) Take by mouth daily.   Vitamin D-Vitamin K (VITAMIN K2-VITAMIN D3 PO) Take by mouth daily.   [DISCONTINUED] chlorpheniramine-HYDROcodone (TUSSIONEX PENNKINETIC ER) 10-8 MG/5ML SUER Take 5 mLs by mouth at bedtime as needed for cough.   [DISCONTINUED] COLLAGEN PO Take by mouth daily.   [DISCONTINUED] Norethin  Ace-Eth Estrad-FE (MINASTRIN 24 FE) 1-20 MG-MCG(24) CHEW Chew 1 mg by mouth daily. (Patient not taking: Reported on 03/24/2018)   [DISCONTINUED] norethindrone  (MICRONOR ,CAMILA ,ERRIN ) 0.35 MG tablet Take 1 tablet (0.35 mg total) by mouth daily.   [DISCONTINUED] norgestimate -ethinyl estradiol  (SPRINTEC 28) 0.25-35 MG-MCG tablet Take 1 tablet by mouth daily. (Patient not taking: Reported on 03/24/2018)   [DISCONTINUED] ondansetron   (ZOFRAN -ODT) 4 MG disintegrating tablet Take 1 tablet (4 mg total) by mouth every 8 (eight) hours as needed for nausea or vomiting.   No facility-administered encounter medications on file as of 11/10/2023.    Past Medical History:  Diagnosis Date   Anemia     Past Surgical History:  Procedure Laterality Date   INCISION AND DRAINAGE PERIRECTAL ABSCESS N/A 11/01/2022   Procedure: IRRIGATION AND DEBRIDEMENT PERIRECTAL ABSCESS;  Surgeon: Dasie Leonor CROME, MD;  Location: [facility] OR;  Service: General;  Laterality: N/A;   NO PAST SURGERIES      History reviewed. No pertinent family history.  Social History   Socioeconomic History   Marital status: Married    Spouse name: Not on file   Number of children: Not on file   Years of education: Not on file   Highest education level: Not on file  Occupational History   Not on file  Tobacco Use   Smoking status: Never   Smokeless tobacco: Never  Substance and Sexual Activity   Alcohol use: No   Drug use: No   Sexual activity: Yes    Birth control/protection: Pill    Comment: camilla  Other Topics Concern   Not on file  Social History Narrative   Not on file   Social Drivers of Health   Financial Resource Strain: Not on file  Food Insecurity: Not on file  Transportation Needs: Not on file  Physical Activity: Not on file  Stress: Not on file  Social  Connections: Not on file  Intimate Partner Violence: Not on file    Review of Systems  Constitutional:  Negative for chills and fever.  Respiratory:  Negative for shortness of breath.   Cardiovascular:  Negative for chest pain.  Gastrointestinal:  Negative for abdominal pain, constipation, diarrhea, heartburn, nausea and vomiting.  Genitourinary:  Negative for dysuria, frequency and urgency.  Skin:        moles  Neurological:  Negative for dizziness and headaches.  Endo/Heme/Allergies:  Negative for polydipsia.  Psychiatric/Behavioral:  Negative for depression and suicidal ideas.  The patient is not nervous/anxious.         Objective    BP 120/80   Pulse 80   Temp 98.3 F (36.8 C) (Temporal)   Ht 5' 6 (1.676 m)   Wt 154 lb (69.9 kg)   LMP 10/22/2023 (Exact Date)   SpO2 98%   BMI 24.86 kg/m   Physical Exam Vitals and nursing note reviewed.  Constitutional:      Appearance: Normal appearance.  Cardiovascular:     Rate and Rhythm: Normal rate and regular rhythm.     Pulses: Normal pulses.     Heart sounds: Normal heart sounds.  Pulmonary:     Effort: Pulmonary effort is normal.     Breath sounds: Normal breath sounds.  Neurological:     Mental Status: She is alert and oriented to person, place, and time.  Psychiatric:        Mood and Affect: Mood normal.        Behavior: Behavior normal.        Thought Content: Thought content normal.        Judgment: Judgment normal.         Assessment & Plan:  Atypical nevi Assessment & Plan: Chronic.  Would like to be referred to her current dermatologist.  Referral placed.   Orders: -     Ambulatory referral to Dermatology  Establishing care with new doctor, encounter for Assessment & Plan: EMR reviewed briefly.      Return in about 2 weeks (around 11/24/2023) for physical with fasting labs on the day.SABRA Carrol Aurora, NP

## 2023-11-26 ENCOUNTER — Ambulatory Visit (INDEPENDENT_AMBULATORY_CARE_PROVIDER_SITE_OTHER): Admitting: General Practice

## 2023-11-26 ENCOUNTER — Ambulatory Visit: Payer: Self-pay | Admitting: General Practice

## 2023-11-26 ENCOUNTER — Encounter: Payer: Self-pay | Admitting: General Practice

## 2023-11-26 VITALS — BP 110/64 | HR 75 | Temp 98.0°F | Ht 66.0 in | Wt 154.0 lb

## 2023-11-26 DIAGNOSIS — Z114 Encounter for screening for human immunodeficiency virus [HIV]: Secondary | ICD-10-CM | POA: Diagnosis not present

## 2023-11-26 DIAGNOSIS — E559 Vitamin D deficiency, unspecified: Secondary | ICD-10-CM

## 2023-11-26 DIAGNOSIS — Z1159 Encounter for screening for other viral diseases: Secondary | ICD-10-CM | POA: Diagnosis not present

## 2023-11-26 DIAGNOSIS — Z0000 Encounter for general adult medical examination without abnormal findings: Secondary | ICD-10-CM

## 2023-11-26 LAB — HEMOGLOBIN A1C: Hgb A1c MFr Bld: 5.5 % (ref 4.6–6.5)

## 2023-11-26 LAB — CBC
HCT: 40 % (ref 36.0–46.0)
Hemoglobin: 13.5 g/dL (ref 12.0–15.0)
MCHC: 33.7 g/dL (ref 30.0–36.0)
MCV: 88.9 fl (ref 78.0–100.0)
Platelets: 223 K/uL (ref 150.0–400.0)
RBC: 4.49 Mil/uL (ref 3.87–5.11)
RDW: 12.6 % (ref 11.5–15.5)
WBC: 4.8 K/uL (ref 4.0–10.5)

## 2023-11-26 LAB — LIPID PANEL
Cholesterol: 252 mg/dL — ABNORMAL HIGH (ref 0–200)
HDL: 82.7 mg/dL (ref >39.00)
LDL Cholesterol: 158 mg/dL — ABNORMAL HIGH (ref 0–99)
NonHDL: 169.53
Total CHOL/HDL Ratio: 3
Triglycerides: 57 mg/dL (ref 0.0–149.0)
VLDL: 11.4 mg/dL (ref 0.0–40.0)

## 2023-11-26 LAB — COMPREHENSIVE METABOLIC PANEL WITH GFR
ALT: 11 U/L (ref 0–35)
AST: 12 U/L (ref 0–37)
Albumin: 4.6 g/dL (ref 3.5–5.2)
Alkaline Phosphatase: 46 U/L (ref 39–117)
BUN: 13 mg/dL (ref 6–23)
CO2: 29 meq/L (ref 19–32)
Calcium: 9.2 mg/dL (ref 8.4–10.5)
Chloride: 103 meq/L (ref 96–112)
Creatinine, Ser: 0.68 mg/dL (ref 0.40–1.20)
GFR: 110.85 mL/min (ref >60.00)
Glucose, Bld: 86 mg/dL (ref 70–99)
Potassium: 4.2 meq/L (ref 3.5–5.1)
Sodium: 139 meq/L (ref 135–145)
Total Bilirubin: 0.8 mg/dL (ref 0.2–1.2)
Total Protein: 7 g/dL (ref 6.0–8.3)

## 2023-11-26 LAB — TSH: TSH: 0.79 u[IU]/mL (ref 0.35–5.50)

## 2023-11-26 LAB — VITAMIN D 25 HYDROXY (VIT D DEFICIENCY, FRACTURES): VITD: 84.58 ng/mL (ref 30.00–100.00)

## 2023-11-26 NOTE — Progress Notes (Signed)
 Established Patient Office Visit  Subjective   Patient ID: Monique [name], female    DOB: Aug 28, 1985  Age: 38 y.o. MRN: [id number]  Chief Complaint  Patient presents with   Annual Exam    HPI  Monique [name] is a 38 year old female with past medical history of acne vulgaris, hypercholesterolemia, presents today for for complete physical and follow up of chronic conditions.  Immunizations: -Tetanus: declines  Diet: Fair diet.  Exercise: regular exercise. Walks daily. Weight lifting 3-4 times a week for 30 mins a day.  Eye exam: never completed. Dental exam: Completes semi-annually    Pap Smear: Completed several years ago; scheduled for 12/08/23.  No family history for colon cancer or breast cancer.   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         11/26/2023   11:05 AM 11/10/2023   12:16 PM  Depression screen PHQ 2/9  Decreased Interest 0 0  Down, Depressed, Hopeless 0 0  PHQ - 2 Score 0 0  Altered sleeping 0 0  Tired, decreased energy 1 0  Change in appetite 0 0  Feeling bad or failure about  yourself  0 0  Trouble concentrating 0 0  Moving slowly or fidgety/restless 0 0  Suicidal thoughts 0 0  PHQ-9 Score 1 0  Difficult doing work/chores Not difficult at all Not difficult at all       11/26/2023   11:06 AM 11/10/2023   12:16 PM  GAD 7 : Generalized Anxiety Score  Nervous, Anxious, on Edge 0 0  Control/stop worrying 0 0  Worry too much - different things 0 0  Trouble relaxing 0 0  Restless 0 0  Easily annoyed or irritable 0 0  Afraid - awful might happen 0 0  Total GAD 7 Score 0 0  Anxiety Difficulty Not difficult at all Not difficult at all      Review of Systems  Constitutional:  Negative for chills, fever, malaise/fatigue and weight loss.  HENT:  Negative for congestion, ear discharge, ear pain, hearing loss, nosebleeds, sinus pain, sore throat and tinnitus.   Eyes:  Negative for blurred vision, double vision, pain, discharge and redness.  Respiratory:  Negative for cough, shortness of breath, wheezing and stridor.   Cardiovascular:  Negative for chest pain, palpitations and leg swelling.  Gastrointestinal:  Negative for abdominal pain, constipation, diarrhea, heartburn, nausea and vomiting.  Genitourinary:  Negative for dysuria, frequency and urgency.  Musculoskeletal:  Negative for myalgias.  Skin:  Negative for rash.  Neurological:  Negative for dizziness, tingling, seizures, weakness  and headaches.  Psychiatric/Behavioral:  Negative for depression, substance abuse and suicidal ideas. The patient is not nervous/anxious.       Objective:     BP 110/64   Pulse 75   Temp 98 F (36.7 C) (Temporal)   Ht 5' 6 (1.676 m)   Wt 154 lb (69.9 kg)   LMP 11/18/2023 (Exact Date)   SpO2 99%   BMI 24.86 kg/m  BP Readings from Last 3 Encounters:  11/26/23 110/64  11/10/23 120/80  11/01/22 121/72   Wt Readings from Last 3 Encounters:  11/26/23 154 lb (69.9 kg)  11/10/23 154 lb (69.9 kg)  11/01/22 145 lb (65.8 kg)      Physical Exam Vitals and nursing note  reviewed.  Constitutional:      Appearance: Normal appearance.  HENT:     Head: Normocephalic and atraumatic.     Right Ear: Tympanic membrane, ear canal and external ear normal.     Left Ear: Tympanic membrane, ear canal and external ear normal.     Nose: Nose normal.     Mouth/Throat:     Mouth: Mucous membranes are moist.     Pharynx: Oropharynx is clear.  Eyes:     Conjunctiva/sclera: Conjunctivae normal.     Pupils: Pupils are equal, round, and reactive to light.  Cardiovascular:     Rate and Rhythm: Normal rate and regular rhythm.     Pulses: Normal pulses.     Heart sounds: Normal heart sounds.  Pulmonary:     Effort: Pulmonary effort is normal.     Breath sounds: Normal breath sounds.  Abdominal:     General: Abdomen is flat. Bowel sounds are normal.     Palpations: Abdomen is soft.  Musculoskeletal:        General: Normal range of motion.     Cervical back: Normal range of motion.  Skin:    General: Skin is warm and dry.     Capillary Refill: Capillary refill takes less than 2 seconds.  Neurological:     General: No focal deficit present.     Mental Status: She is alert and oriented to person, place, and time. Mental status is at baseline.  Psychiatric:        Mood and Affect: Mood normal.        Behavior: Behavior normal.        Thought Content: Thought content normal.        Judgment: Judgment normal.      No results found for any visits on 11/26/23.     The ASCVD Risk score (Arnett DK, et al., 2019) failed to calculate for the following reasons:   The 2019 ASCVD risk score is only valid for ages 33 to 51    Assessment & Plan:  Encounter for screening and preventative care Assessment & Plan: Immunizations due Pap smear scheduled.  Discussed the importance of a healthy diet and regular exercise in order for weight loss, and to reduce the risk of further co-morbidity.  Exam stable. Labs pending.  Follow up in 1 year for repeat physical.    Orders: -     CBC -     Comprehensive metabolic panel with GFR -     Lipid panel -     TSH -     Hemoglobin A1c  Vitamin D  deficiency -     VITAMIN D  25 Hydroxy (Vit-D Deficiency, Fractures)  Need for hepatitis C screening test -     Hepatitis  C antibody  Screening for HIV (human immunodeficiency virus) -     HIV Antibody (routine testing w rflx)    Return in about 6 months (around 05/28/2024) for HLD.SABRA Carrol Aurora, NP

## 2023-11-26 NOTE — Assessment & Plan Note (Signed)
 Immunizations due Pap smear scheduled.  Discussed the importance of a healthy diet and regular exercise in order for weight loss, and to reduce the risk of further co-morbidity.  Exam stable. Labs pending.  Follow up in 1 year for repeat physical.

## 2023-11-26 NOTE — Patient Instructions (Addendum)
 Stop by the lab prior to leaving today. I will notify you of your results once received.   Schedule 6 month follow up for cholesterol.   It was a pleasure to see you today!

## 2023-11-27 LAB — HIV ANTIBODY (ROUTINE TESTING W REFLEX): HIV 1&2 Ab, 4th Generation: NONREACTIVE

## 2023-11-27 LAB — HEPATITIS C ANTIBODY: Hepatitis C Ab: NONREACTIVE

## 2023-12-08 DIAGNOSIS — K644 Residual hemorrhoidal skin tags: Secondary | ICD-10-CM | POA: Diagnosis not present

## 2023-12-08 DIAGNOSIS — Z0000 Encounter for general adult medical examination without abnormal findings: Secondary | ICD-10-CM | POA: Diagnosis not present

## 2023-12-10 ENCOUNTER — Other Ambulatory Visit: Payer: Self-pay

## 2023-12-10 ENCOUNTER — Observation Stay (HOSPITAL_BASED_OUTPATIENT_CLINIC_OR_DEPARTMENT_OTHER)
Admission: EM | Admit: 2023-12-10 | Discharge: 2023-12-11 | Disposition: A | Discharge status: Home / Self Care | Source: Ambulatory Visit | Attending: Surgery | Admitting: Surgery

## 2023-12-10 ENCOUNTER — Emergency Department (HOSPITAL_BASED_OUTPATIENT_CLINIC_OR_DEPARTMENT_OTHER)

## 2023-12-10 DIAGNOSIS — K612 Anorectal abscess: Principal | ICD-10-CM | POA: Insufficient documentation

## 2023-12-10 DIAGNOSIS — K610 Anal abscess: Principal | ICD-10-CM | POA: Diagnosis present

## 2023-12-10 DIAGNOSIS — A419 Sepsis, unspecified organism: Secondary | ICD-10-CM | POA: Diagnosis not present

## 2023-12-10 DIAGNOSIS — K611 Rectal abscess: Secondary | ICD-10-CM | POA: Diagnosis present

## 2023-12-10 DIAGNOSIS — K429 Umbilical hernia without obstruction or gangrene: Secondary | ICD-10-CM | POA: Diagnosis not present

## 2023-12-10 LAB — URINALYSIS, ROUTINE W REFLEX MICROSCOPIC
Bilirubin Urine: NEGATIVE
Glucose, UA: NEGATIVE mg/dL
Hgb urine dipstick: NEGATIVE
Ketones, ur: 40 mg/dL — AB
Nitrite: NEGATIVE
Protein, ur: NEGATIVE mg/dL
Specific Gravity, Urine: 1.018 (ref 1.005–1.030)
pH: 5.5 (ref 5.0–8.0)

## 2023-12-10 LAB — CBC WITH DIFFERENTIAL/PLATELET
Abs Immature Granulocytes: 0.08 K/uL — ABNORMAL HIGH (ref 0.00–0.07)
Basophils Absolute: 0.1 K/uL (ref 0.0–0.1)
Basophils Relative: 0 %
Eosinophils Absolute: 0.1 K/uL (ref 0.0–0.5)
Eosinophils Relative: 1 %
HCT: 37.3 % (ref 36.0–46.0)
Hemoglobin: 13 g/dL (ref 12.0–15.0)
Immature Granulocytes: 1 %
Lymphocytes Relative: 10 %
Lymphs Abs: 1.7 K/uL (ref 0.7–4.0)
MCH: 30.1 pg (ref 26.0–34.0)
MCHC: 34.9 g/dL (ref 30.0–36.0)
MCV: 86.3 fL (ref 80.0–100.0)
Monocytes Absolute: 1.2 K/uL — ABNORMAL HIGH (ref 0.1–1.0)
Monocytes Relative: 7 %
Neutro Abs: 14 K/uL — ABNORMAL HIGH (ref 1.7–7.7)
Neutrophils Relative %: 81 %
Platelets: 213 K/uL (ref 150–400)
RBC: 4.32 MIL/uL (ref 3.87–5.11)
RDW: 11.9 % (ref 11.5–15.5)
WBC: 17.1 K/uL — ABNORMAL HIGH (ref 4.0–10.5)
nRBC: 0 % (ref 0.0–0.2)

## 2023-12-10 LAB — COMPREHENSIVE METABOLIC PANEL WITH GFR
ALT: 11 U/L (ref 0–44)
AST: 17 U/L (ref 15–41)
Albumin: 4.7 g/dL (ref 3.5–5.0)
Alkaline Phosphatase: 64 U/L (ref 38–126)
Anion gap: 14 (ref 5–15)
BUN: 12 mg/dL (ref 6–20)
CO2: 20 mmol/L — ABNORMAL LOW (ref 22–32)
Calcium: 9.7 mg/dL (ref 8.9–10.3)
Chloride: 103 mmol/L (ref 98–111)
Creatinine, Ser: 0.79 mg/dL (ref 0.44–1.00)
GFR, Estimated: >60 mL/min (ref >60)
Glucose, Bld: 112 mg/dL — ABNORMAL HIGH (ref 70–99)
Potassium: 3.8 mmol/L (ref 3.5–5.1)
Sodium: 137 mmol/L (ref 135–145)
Total Bilirubin: 0.5 mg/dL (ref 0.0–1.2)
Total Protein: 7.5 g/dL (ref 6.5–8.1)

## 2023-12-10 LAB — PREGNANCY, URINE: Preg Test, Ur: NEGATIVE

## 2023-12-10 MED ORDER — LACTATED RINGERS IV BOLUS
1000.0000 mL | Freq: Once | INTRAVENOUS | Status: AC
  Administered 2023-12-10: 1000 mL via INTRAVENOUS

## 2023-12-10 MED ORDER — IOHEXOL 300 MG/ML  SOLN
100.0000 mL | Freq: Once | INTRAMUSCULAR | Status: AC | PRN
  Administered 2023-12-10: 75 mL via INTRAVENOUS

## 2023-12-10 MED ORDER — PIPERACILLIN-TAZOBACTAM 3.375 G IVPB 30 MIN
3.3750 g | Freq: Once | INTRAVENOUS | Status: AC
  Administered 2023-12-11: 3.375 g via INTRAVENOUS
  Filled 2023-12-10: qty 50

## 2023-12-10 MED ORDER — FENTANYL CITRATE PF 50 MCG/ML IJ SOSY
50.0000 ug | PREFILLED_SYRINGE | Freq: Once | INTRAMUSCULAR | Status: AC
  Administered 2023-12-10: 50 ug via INTRAVENOUS
  Filled 2023-12-10: qty 1

## 2023-12-10 MED ORDER — VANCOMYCIN HCL IN DEXTROSE 1-5 GM/200ML-% IV SOLN
1000.0000 mg | Freq: Once | INTRAVENOUS | Status: AC
  Administered 2023-12-11: 1000 mg via INTRAVENOUS
  Filled 2023-12-10: qty 200

## 2023-12-10 NOTE — Progress Notes (Signed)
 PROVIDER:  PUJA GOSAI MACZIS, PA  MRN: [id number] DOB: 03/23/1986 DATE OF ENCOUNTER: 12/10/2023 Interval History:   Monique [name] is a 38 y.o. female who underwent incision and drainage of left anterolateral perianal abscess on 11/01/2022 by Dr. Dasie.  She followed up with me on 11/19/2022 at which time she was doing well.  However, she presented to the office on 12/15/2022 after she noticed bloody drainage from the scar. No signs of active infection at that time.  We called Augmentin  in for her about 2 weeks later for drainage again.  Today, 12/10/2023, she is presenting to the office with concerns of a recurrent abscess.  She states about 1 week ago she had an inflamed hemorrhoid but since then she developed swelling and pain near the old scar.  She denies fever and drainage.  She denies constipation/diarrhea and has been taking fiber.  She has not had any issues with this area since last year.  Does not take any blood thinners.  Review of Systems:   ROS All other systems reviewed and are negative.  Medications:   Current Outpatient Medications on File Prior to Visit  Medication Sig Dispense Refill  . ascorbic acid (VITAMIN C ORAL) Take by mouth    . multivitamin capsule Take 1 capsule by mouth once daily    . VITAMIN D3 ORAL Take by mouth    . ZINC ORAL Take by mouth     No current facility-administered medications on file prior to visit.    Physical Examination:   BP 114/80   Pulse 75   Temp 37.1 C (98.8 F)   Ht 165.1 cm (5' 5)   Wt 70.9 kg (156 lb 3.2 oz)   SpO2 98%   BMI 25.99 kg/m  General: Well-developed, well-nourished, in no acute distress.   Rectal: Left anterior perianal region -medial to the scar, there is a raised area of firmness with tenderness to palpation.  No overlying erythema.    Incision and Drainage Procedure Note  Location: Left anterior perianal region, medial to scar  Diagnosis: Abscess  Anesthesia: 1% lidocaine  with epi  Consent form filled  out: yes  Procedure Details: The pathophysiology of subcutaneous abscess and differential diagnosis was discussed.  Natural history progression was discussed.  The patient's symptoms are not adequately controlled.  Non-operative treatment has not healed the abscess.  Therefore, I recommended incision & drainage of the abscess to allow the infection to resolve and heal.  Technique, risks, benefits, and alternatives discussed.  The patient expressed understanding and signed consent to the procedure.  The skin was sterilely prepped over the affected area.  After adequate local anesthesia, an 18-gauge needle was used to probe the area but no fluid was aspirated.  #11 blade was used to incise the skin over the abscess.  A cavity was not found with probing. There were no immediate complications.   Assessment and Plan:   Diagnoses and all orders for this visit:  Perianal abscess  Other orders -     amoxicillin -clavulanate (AUGMENTIN ) 875-125 mg tablet; Take 1 tablet (875 mg total) by mouth every 12 (twelve) hours for 7 days   Monique [name] is status post I&D of left anterior perirectal abscess on 11/01/2022 by Dr. Dasie.  On exam, she has an area of firmness and tenderness medial to the scar from previous I&D.  I recommended aspiration with possible I&D of this area.  Unfortunately, I was not able to evacuate any fluid.  I made an incision to  see if I could enter a cavity, but a cavity was not encountered.  She has a trip coming up for her birthday and anniversary to the beach.  Given that she is planning to potentially go out of town we discussed going to the ER for CT scan of this area as I believe there may still be a fluid collection given her history and exam.  I sent Augmentin  to her pharmacy as well.  Puja Maczis, [facility]

## 2023-12-10 NOTE — ED Triage Notes (Signed)
 Pt POV from surgical office, perineal abscess, went to have it drained, too deep, advised to come to ED for CT.

## 2023-12-10 NOTE — ED Provider Notes (Signed)
 [place] EMERGENCY DEPARTMENT AT [facility] Provider Note   CSN: [id number] Arrival date & time: 12/10/23  1717     Patient presents with: Abscess   Monique [name] is a 38 y.o. female.   Patient sent from surgical office for CT scan.  She is concern for recurrent perineal abscess.  She had a similar abscess drained by surgery in July 2024.  Over the past 1 week she is up to bulging to her left perineum between her vagina and anus.  Has denied any bleeding or drainage.  No fever, chills, nausea or vomiting.  Having severe pain.  Went to the surgical office today where bedside drainage was attempted and not successful.  She was sent to the ED for CT scan.  She denies any bleeding prior to the procedure  The history is provided by the patient.  Abscess Associated symptoms: no fever, no headaches, no nausea and no vomiting        Prior to Admission medications   Medication Sig Start Date End Date Taking? Authorizing Provider  APPLE CIDER VINEGAR PO Take by mouth daily.    [provider]  MAGNESIUM GLUCONATE PO Take by mouth daily.    [provider]  Multiple Vitamins-Minerals (WOMENS MULTIVITAMIN PO) Take by mouth daily.    [provider]  Probiotic Product (PROBIOTIC DAILY PO) Take by mouth daily.    [provider]  Vitamin D -Vitamin K (VITAMIN K2-VITAMIN D3 PO) Take by mouth daily.    [provider]    Allergies: Rocephin  [ceftriaxone ]    Review of Systems  Constitutional:  Negative for activity change, appetite change and fever.  HENT:  Negative for congestion and rhinorrhea.   Respiratory:  Negative for chest tightness.   Cardiovascular:  Negative for chest pain.  Gastrointestinal:  Negative for abdominal pain, nausea and vomiting.  Genitourinary:  Negative for dysuria and hematuria.  Musculoskeletal:  Negative for arthralgias and myalgias.  Skin:  Positive for wound. Negative for rash.  Neurological:  Negative for  dizziness, weakness and headaches.   all other systems are negative except as noted in the HPI and PMH.    Updated Vital Signs BP (!) 142/83 (BP Location: Right Arm)   Pulse (!) 115   Temp 99.2 F (37.3 C) (Oral)   Resp 18   Ht 5' 5 (1.651 m)   Wt 70.3 kg   LMP 11/18/2023 (Exact Date)   SpO2 100%   BMI 25.79 kg/m   Physical Exam Vitals and nursing note reviewed.  Constitutional:      General: She is not in acute distress.    Appearance: She is well-developed.  HENT:     Head: Normocephalic and atraumatic.     Mouth/Throat:     Pharynx: No oropharyngeal exudate.  Eyes:     Conjunctiva/sclera: Conjunctivae normal.     Pupils: Pupils are equal, round, and reactive to light.  Neck:     Comments: No meningismus. Cardiovascular:     Rate and Rhythm: Regular rhythm. Tachycardia present.     Heart sounds: Normal heart sounds. No murmur heard. Pulmonary:     Effort: Pulmonary effort is normal. No respiratory distress.     Breath sounds: Normal breath sounds.  Abdominal:     Palpations: Abdomen is soft.     Tenderness: There is no abdominal tenderness. There is no guarding or rebound.  Genitourinary:    Comments: Chaperone present Astronomer.  There is firm induration to left perianal  area adjacent to the labia.  No fluctuance but there is induration.  Stab incision identified with bloody drainage. Musculoskeletal:        General: No tenderness. Normal range of motion.     Cervical back: Normal range of motion and neck supple.  Skin:    General: Skin is warm.  Neurological:     Mental Status: She is alert and oriented to person, place, and time.     Cranial Nerves: No cranial nerve deficit.     Motor: No abnormal muscle tone.     Coordination: Coordination normal.     Comments:  5/5 strength throughout. CN 2-12 intact.Equal grip strength.   Psychiatric:        Behavior: Behavior normal.     (all labs ordered are listed, but only abnormal results are displayed) Labs  Reviewed  COMPREHENSIVE METABOLIC PANEL WITH GFR - Abnormal; Notable for the following components:      Result Value   CO2 20 (*)    Glucose, Bld 112 (*)    All other components within normal limits  CBC WITH DIFFERENTIAL/PLATELET - Abnormal; Notable for the following components:   WBC 17.1 (*)    Neutro Abs 14.0 (*)    Monocytes Absolute 1.2 (*)    Abs Immature Granulocytes 0.08 (*)    All other components within normal limits  URINALYSIS, ROUTINE W REFLEX MICROSCOPIC - Abnormal; Notable for the following components:   Ketones, ur 40 (*)    Leukocytes,Ua LARGE (*)    Bacteria, UA RARE (*)    All other components within normal limits  CULTURE, BLOOD (ROUTINE X 2)  CULTURE, BLOOD (ROUTINE X 2)  PREGNANCY, URINE  LACTIC ACID, PLASMA  LACTIC ACID, PLASMA    EKG: None  Radiology: CT ABDOMEN PELVIS W CONTRAST Result Date: 12/10/2023 CLINICAL DATA:  Sepsis with suspected pelvic abscess. EXAM: CT ABDOMEN AND PELVIS WITH CONTRAST TECHNIQUE: Multidetector CT imaging of the abdomen and pelvis was performed using the standard protocol following bolus administration of intravenous contrast. RADIATION DOSE REDUCTION: This exam was performed according to the departmental dose-optimization program which includes automated exposure control, adjustment of the mA and/or kV according to patient size and/or use of iterative reconstruction technique. CONTRAST:  75mL OMNIPAQUE  IOHEXOL  300 MG/ML  SOLN COMPARISON:  CTs with IV contrast 11/01/2022, 02/11/2017. FINDINGS: Lower chest: No acute abnormality. Hepatobiliary: No focal liver abnormality is seen. No gallstones, gallbladder wall thickening, or biliary dilatation. Pancreas: No abnormality. Spleen: No abnormality. Adrenals/Urinary Tract: Adrenal glands are unremarkable. Kidneys are normal, without renal calculi, focal lesion, or hydronephrosis. Bladder is unremarkable. Stomach/Bowel: No dilatation or wall thickening including the retrocecal appendix.  Vascular/Lymphatic: No significant vascular findings are present. No enlarged abdominal or pelvic lymph nodes. Bilateral borderline prominent inguinal chain nodes are similar to the last CT. Reproductive: The uterus is intact. Ovaries are stable in size and configuration. There is a chronic thin walled fluid homogeneous collection in the cul-de-sac space eccentric to the left measuring 6.4 x 3.5 cm, not significantly changed, Hounsfield density is 18. This would either represent a paraovarian cyst or an intestinal duplication cyst as there's abutment with both the sigmoid colon and rectum. There have been no significant interval changes. Other: In the lower medial left buttock there is an irregular thick walled perianal rim enhancing air and fluid collection consistent with an abscess noted in the subcutaneous plane and measuring 2.6 x 2.7 by 3.1 cm. Similar findings were noted on the last CT. Adjacent inflammatory stranding  extends down into the perineum but there is no evidence of an acute necrotizing process. No gas is seen in the soft tissues. The inflammatory changes do not appear to involve the pelvis proper. There is no free fluid, free hemorrhage or free air, no incarcerated hernia. Small umbilical fat hernia. Musculoskeletal: No acute or significant osseous findings. IMPRESSION: 1. 2.6 x 2.7 x 3.1 cm subcutaneous perianal abscess in the lower medial left buttock, with adjacent inflammatory stranding extending down into the perineum but no evidence of an acute necrotizing process. 2. No acute intra-abdominal or intrapelvic abnormality. Stable borderline prominent inguinal chain nodes. 3. 6.4 x 3.5 cm thin walled fluid collection in the cul-de-sac space eccentric to the left. This would either represent a paraovarian cyst or an intestinal duplication cyst as there's abutment with both the sigmoid colon and rectum. There have been no significant interval changes. 4. Small umbilical fat hernia. Electronically  Signed   By: Francis Quam M.D.   On: 12/10/2023 23:35     Procedures   Medications Ordered in the ED  lactated ringers  bolus 1,000 mL (has no administration in time range)  vancomycin  (VANCOCIN ) IVPB 1000 mg/200 mL premix (has no administration in time range)  piperacillin -tazobactam (ZOSYN ) IVPB 3.375 g (has no administration in time range)  fentaNYL  (SUBLIMAZE ) injection 50 mcg (has no administration in time range)  iohexol  (OMNIPAQUE ) 300 MG/ML solution 100 mL (75 mLs Intravenous Contrast Given 12/10/23 2310)                                    Medical Decision Making Amount and/or Complexity of Data Reviewed Labs: ordered. Decision-making details documented in ED Course. Radiology: ordered and independent interpretation performed. Decision-making details documented in ED Course. ECG/medicine tests: ordered and independent interpretation performed. Decision-making details documented in ED Course.  Risk Prescription drug management. Decision regarding hospitalization.   Patient sent from surgical office for CT scan.  Concern for perianal abscess.  She is tachycardic but not febrile.  Labs show leukocytosis She is given IV fluids, pain and nausea medication and IV antibiotics.  She is tachycardic but does not appear to be toxic or septic.  IV fluids and IV antibiotics given.  Labs show leukocytosis.  CT scan as recommended by general surgery shows recurrent perineal and perianal abscess. Patient aware of cystic structure to the left adnexa and follows with gynecology for this.  She denies any symptoms in this area.  Remains tachycardic but afebrile.  CT scan results discussed with Dr. Sebastian of general surgery. Patient declines further bedside I&D attempts and does not feel like she can tolerate it.  Abscess was too deep to be drained at surgical office. Plan admission for likely drainage in the OR tomorrow morning.  Continue IV antibiotics and IV fluids.  Dr. Sebastian to  admit patient.  Patient and spouse agreeable.     Final diagnoses:  Perianal abscess    ED Discharge Orders     None          Deshanta Lady, Garnette, MD 12/11/23 [phone]

## 2023-12-11 ENCOUNTER — Encounter (HOSPITAL_COMMUNITY)
Admission: EM | Disposition: A | Discharge status: Home / Self Care | Payer: Self-pay | Source: Ambulatory Visit | Attending: Emergency Medicine

## 2023-12-11 ENCOUNTER — Other Ambulatory Visit: Payer: Self-pay

## 2023-12-11 ENCOUNTER — Encounter (HOSPITAL_COMMUNITY): Payer: Self-pay | Admitting: General Surgery

## 2023-12-11 ENCOUNTER — Observation Stay (HOSPITAL_BASED_OUTPATIENT_CLINIC_OR_DEPARTMENT_OTHER): Admitting: Anesthesiology

## 2023-12-11 ENCOUNTER — Observation Stay (HOSPITAL_COMMUNITY): Admitting: Anesthesiology

## 2023-12-11 DIAGNOSIS — K610 Anal abscess: Principal | ICD-10-CM | POA: Diagnosis present

## 2023-12-11 DIAGNOSIS — Z743 Need for continuous supervision: Secondary | ICD-10-CM | POA: Diagnosis not present

## 2023-12-11 DIAGNOSIS — K611 Rectal abscess: Secondary | ICD-10-CM | POA: Diagnosis present

## 2023-12-11 HISTORY — PX: INCISION AND DRAINAGE ABSCESS: SHX5864

## 2023-12-11 LAB — BASIC METABOLIC PANEL WITH GFR
Anion gap: 9 (ref 5–15)
BUN: 6 mg/dL (ref 6–20)
CO2: 22 mmol/L (ref 22–32)
Calcium: 9 mg/dL (ref 8.9–10.3)
Chloride: 106 mmol/L (ref 98–111)
Creatinine, Ser: 0.68 mg/dL (ref 0.44–1.00)
GFR, Estimated: >60 mL/min (ref >60)
Glucose, Bld: 105 mg/dL — ABNORMAL HIGH (ref 70–99)
Potassium: 3.7 mmol/L (ref 3.5–5.1)
Sodium: 137 mmol/L (ref 135–145)

## 2023-12-11 LAB — CBC
HCT: 36.1 % (ref 36.0–46.0)
Hemoglobin: 12.4 g/dL (ref 12.0–15.0)
MCH: 30.2 pg (ref 26.0–34.0)
MCHC: 34.3 g/dL (ref 30.0–36.0)
MCV: 87.8 fL (ref 80.0–100.0)
Platelets: 183 K/uL (ref 150–400)
RBC: 4.11 MIL/uL (ref 3.87–5.11)
RDW: 12 % (ref 11.5–15.5)
WBC: 15.9 K/uL — ABNORMAL HIGH (ref 4.0–10.5)
nRBC: 0 % (ref 0.0–0.2)

## 2023-12-11 LAB — LACTIC ACID, PLASMA
Lactic Acid, Venous: 0.7 mmol/L (ref 0.5–1.9)
Lactic Acid, Venous: 0.7 mmol/L (ref 0.5–1.9)

## 2023-12-11 LAB — SURGICAL PCR SCREEN
MRSA, PCR: NEGATIVE
Staphylococcus aureus: POSITIVE — AB

## 2023-12-11 SURGERY — INCISION AND DRAINAGE, ABSCESS
Anesthesia: General

## 2023-12-11 MED ORDER — ACETAMINOPHEN 500 MG PO TABS: 1000.0000 mg | ORAL_TABLET | Freq: Three times a day (TID) | ORAL | Status: DC | PRN

## 2023-12-11 MED ORDER — LACTATED RINGERS IV SOLN: INTRAVENOUS | Status: DC

## 2023-12-11 MED ORDER — SODIUM CHLORIDE 0.9 % IV SOLN: 12.5000 mg | INTRAVENOUS | Status: DC | PRN

## 2023-12-11 MED ORDER — PROPOFOL 10 MG/ML IV BOLUS
INTRAVENOUS | Status: DC | PRN
  Administered 2023-12-11: 200 mg via INTRAVENOUS

## 2023-12-11 MED ORDER — ACETAMINOPHEN 500 MG PO TABS: 1000.0000 mg | ORAL_TABLET | Freq: Four times a day (QID) | ORAL | Status: DC

## 2023-12-11 MED ORDER — DEXAMETHASONE SODIUM PHOSPHATE 10 MG/ML IJ SOLN
INTRAMUSCULAR | Status: DC | PRN
  Administered 2023-12-11: 5 mg via INTRAVENOUS

## 2023-12-11 MED ORDER — DIPHENHYDRAMINE HCL 50 MG/ML IJ SOLN: 25.0000 mg | Freq: Four times a day (QID) | INTRAMUSCULAR | Status: DC | PRN

## 2023-12-11 MED ORDER — TRAMADOL HCL 50 MG PO TABS: 50.0000 mg | ORAL_TABLET | Freq: Four times a day (QID) | ORAL | 0 refills | Status: AC | PRN

## 2023-12-11 MED ORDER — ONDANSETRON HCL 4 MG/2ML IJ SOLN
4.0000 mg | Freq: Four times a day (QID) | INTRAMUSCULAR | Status: DC | PRN
Start: 2023-12-11 — End: 2023-12-11
  Administered 2023-12-11: 4 mg via INTRAVENOUS
  Filled 2023-12-11: qty 2

## 2023-12-11 MED ORDER — PIPERACILLIN-TAZOBACTAM 3.375 G IVPB
3.3750 g | Freq: Three times a day (TID) | INTRAVENOUS | Status: DC
  Administered 2023-12-11 (×2): 3.375 g via INTRAVENOUS
  Filled 2023-12-11 (×2): qty 50

## 2023-12-11 MED ORDER — PROPOFOL 10 MG/ML IV BOLUS
INTRAVENOUS | Status: AC
  Filled 2023-12-11: qty 20

## 2023-12-11 MED ORDER — BUPIVACAINE-EPINEPHRINE (PF) 0.25% -1:200000 IJ SOLN
INTRAMUSCULAR | Status: AC
  Filled 2023-12-11: qty 30

## 2023-12-11 MED ORDER — POTASSIUM CHLORIDE IN NACL 20-0.45 MEQ/L-% IV SOLN
INTRAVENOUS | Status: DC
  Filled 2023-12-11 (×2): qty 1000

## 2023-12-11 MED ORDER — CHLORHEXIDINE GLUCONATE 0.12 % MT SOLN
OROMUCOSAL | Status: AC
  Filled 2023-12-11: qty 15

## 2023-12-11 MED ORDER — FENTANYL CITRATE PF 50 MCG/ML IJ SOSY
50.0000 ug | PREFILLED_SYRINGE | Freq: Once | INTRAMUSCULAR | Status: AC
  Administered 2023-12-11: 50 ug via INTRAVENOUS
  Filled 2023-12-11: qty 1

## 2023-12-11 MED ORDER — DIPHENHYDRAMINE HCL 25 MG PO CAPS: 25.0000 mg | ORAL_CAPSULE | Freq: Four times a day (QID) | ORAL | Status: DC | PRN

## 2023-12-11 MED ORDER — BUPIVACAINE-EPINEPHRINE (PF) 0.25% -1:200000 IJ SOLN
INTRAMUSCULAR | Status: DC | PRN
  Administered 2023-12-11: 30 mL

## 2023-12-11 MED ORDER — MIDAZOLAM HCL 2 MG/2ML IJ SOLN
INTRAMUSCULAR | Status: AC
Start: 2023-12-11 — End: 2023-12-11
  Filled 2023-12-11: qty 2

## 2023-12-11 MED ORDER — FENTANYL CITRATE (PF) 250 MCG/5ML IJ SOLN
INTRAMUSCULAR | Status: AC
  Filled 2023-12-11: qty 5

## 2023-12-11 MED ORDER — ALBUTEROL SULFATE HFA 108 (90 BASE) MCG/ACT IN AERS
INHALATION_SPRAY | RESPIRATORY_TRACT | Status: AC
  Filled 2023-12-11: qty 6.7

## 2023-12-11 MED ORDER — ONDANSETRON HCL 4 MG/2ML IJ SOLN
INTRAMUSCULAR | Status: DC | PRN
  Administered 2023-12-11: 4 mg via INTRAVENOUS

## 2023-12-11 MED ORDER — CHLORHEXIDINE GLUCONATE 0.12 % MT SOLN
15.0000 mL | Freq: Once | OROMUCOSAL | Status: AC
  Administered 2023-12-11: 15 mL via OROMUCOSAL

## 2023-12-11 MED ORDER — SODIUM CHLORIDE 0.9 % IV SOLN: INTRAVENOUS | Status: DC

## 2023-12-11 MED ORDER — MORPHINE SULFATE (PF) 4 MG/ML IV SOLN
4.0000 mg | INTRAVENOUS | Status: DC | PRN
  Administered 2023-12-11: 4 mg via INTRAVENOUS
  Filled 2023-12-11: qty 1

## 2023-12-11 MED ORDER — LIDOCAINE 2% (20 MG/ML) 5 ML SYRINGE
INTRAMUSCULAR | Status: DC | PRN
  Administered 2023-12-11: 60 mg via INTRAVENOUS

## 2023-12-11 MED ORDER — FENTANYL CITRATE (PF) 250 MCG/5ML IJ SOLN
INTRAMUSCULAR | Status: DC | PRN
  Administered 2023-12-11: 50 ug via INTRAVENOUS

## 2023-12-11 MED ORDER — MIDAZOLAM HCL 2 MG/2ML IJ SOLN
INTRAMUSCULAR | Status: DC | PRN
  Administered 2023-12-11: 2 mg via INTRAVENOUS

## 2023-12-11 MED ORDER — CHLORHEXIDINE GLUCONATE CLOTH 2 % EX PADS
6.0000 | MEDICATED_PAD | Freq: Every day | CUTANEOUS | Status: DC
  Administered 2023-12-11: 6 via TOPICAL

## 2023-12-11 MED ORDER — ACETAMINOPHEN 500 MG PO TABS
ORAL_TABLET | ORAL | Status: AC
  Filled 2023-12-11: qty 2

## 2023-12-11 MED ORDER — MUPIROCIN 2 % EX OINT: 1.0000 | TOPICAL_OINTMENT | Freq: Two times a day (BID) | CUTANEOUS | Status: DC

## 2023-12-11 MED ORDER — CELECOXIB 200 MG PO CAPS
200.0000 mg | ORAL_CAPSULE | Freq: Once | ORAL | Status: AC
  Administered 2023-12-11: 200 mg via ORAL

## 2023-12-11 MED ORDER — ONDANSETRON HCL 4 MG/2ML IJ SOLN
INTRAMUSCULAR | Status: AC
  Filled 2023-12-11: qty 2

## 2023-12-11 MED ORDER — DEXAMETHASONE SODIUM PHOSPHATE 10 MG/ML IJ SOLN
INTRAMUSCULAR | Status: AC
  Filled 2023-12-11: qty 1

## 2023-12-11 MED ORDER — AMISULPRIDE (ANTIEMETIC) 5 MG/2ML IV SOLN: 10.0000 mg | Freq: Once | INTRAVENOUS | Status: DC | PRN

## 2023-12-11 MED ORDER — HYDROMORPHONE HCL 1 MG/ML IJ SOLN
1.0000 mg | Freq: Once | INTRAMUSCULAR | Status: DC
  Filled 2023-12-11: qty 1

## 2023-12-11 MED ORDER — FENTANYL CITRATE (PF) 100 MCG/2ML IJ SOLN: 25.0000 ug | INTRAMUSCULAR | Status: DC | PRN

## 2023-12-11 MED ORDER — ACETAMINOPHEN 500 MG PO TABS: 1000.0000 mg | ORAL_TABLET | Freq: Once | ORAL | Status: DC

## 2023-12-11 MED ORDER — LIDOCAINE 2% (20 MG/ML) 5 ML SYRINGE
INTRAMUSCULAR | Status: AC
  Filled 2023-12-11: qty 5

## 2023-12-11 MED ORDER — 0.9 % SODIUM CHLORIDE (POUR BTL) OPTIME
TOPICAL | Status: DC | PRN
  Administered 2023-12-11: 1000 mL

## 2023-12-11 MED ORDER — OXYCODONE HCL 5 MG PO TABS: 5.0000 mg | ORAL_TABLET | Freq: Once | ORAL | Status: DC | PRN

## 2023-12-11 MED ORDER — CELECOXIB 200 MG PO CAPS
ORAL_CAPSULE | ORAL | Status: AC
  Filled 2023-12-11: qty 1

## 2023-12-11 MED ORDER — OXYCODONE HCL 5 MG PO TABS
5.0000 mg | ORAL_TABLET | ORAL | Status: DC | PRN
  Administered 2023-12-11: 5 mg via ORAL
  Administered 2023-12-11: 10 mg via ORAL
  Filled 2023-12-11: qty 1
  Filled 2023-12-11 (×2): qty 2

## 2023-12-11 MED ORDER — ONDANSETRON 4 MG PO TBDP: 4.0000 mg | ORAL_TABLET | Freq: Four times a day (QID) | ORAL | Status: DC | PRN

## 2023-12-11 MED ORDER — ORAL CARE MOUTH RINSE: 15.0000 mL | Freq: Once | OROMUCOSAL | Status: AC

## 2023-12-11 MED ORDER — OXYCODONE HCL 5 MG/5ML PO SOLN: 5.0000 mg | Freq: Once | ORAL | Status: DC | PRN

## 2023-12-11 SURGICAL SUPPLY — 25 items
BAG COUNTER SPONGE SURGICOUNT (BAG) ×2 IMPLANT
BLADE SURG 11 STRL SS (BLADE) ×2 IMPLANT
COVER MAYO STAND STRL (DRAPES) ×2 IMPLANT
COVER SURGICAL LIGHT HANDLE (MISCELLANEOUS) ×2 IMPLANT
ELECT CAUTERY BLADE 6.4 (BLADE) ×2 IMPLANT
ELECTRODE REM PT RTRN 9FT ADLT (ELECTROSURGICAL) ×2 IMPLANT
GAUZE SPONGE 4X4 12PLY STRL (GAUZE/BANDAGES/DRESSINGS) IMPLANT
GLOVE BIOGEL PI IND STRL 6 (GLOVE) ×2 IMPLANT
GLOVE BIOGEL PI MICRO STRL 5.5 (GLOVE) ×2 IMPLANT
GOWN STRL REUS W/ TWL LRG LVL3 (GOWN DISPOSABLE) ×4 IMPLANT
KIT BASIN OR (CUSTOM PROCEDURE TRAY) ×2 IMPLANT
KIT TURNOVER KIT B (KITS) ×2 IMPLANT
NDL HYPO 25GX1X1/2 BEV (NEEDLE) IMPLANT
NEEDLE HYPO 25GX1X1/2 BEV (NEEDLE) ×1 IMPLANT
NS IRRIG 1000ML POUR BTL (IV SOLUTION) ×2 IMPLANT
PACK LITHOTOMY IV (CUSTOM PROCEDURE TRAY) IMPLANT
PAD ABD 8X10 STRL (GAUZE/BANDAGES/DRESSINGS) IMPLANT
PAD ARMBOARD POSITIONER FOAM (MISCELLANEOUS) ×2 IMPLANT
PENCIL SMOKE EVACUATOR (MISCELLANEOUS) ×2 IMPLANT
SWAB COLLECTION DEVICE MRSA (MISCELLANEOUS) IMPLANT
SWAB CULTURE ESWAB REG 1ML (MISCELLANEOUS) IMPLANT
SYR CONTROL 10ML LL (SYRINGE) IMPLANT
TOWEL GREEN STERILE (TOWEL DISPOSABLE) ×2 IMPLANT
TUBE CONNECTING 12X1/4 (SUCTIONS) ×2 IMPLANT
YANKAUER SUCT BULB TIP NO VENT (SUCTIONS) ×2 IMPLANT

## 2023-12-11 NOTE — Anesthesia Postprocedure Evaluation (Signed)
 Anesthesia Post Note  Patient: Monique [name]  Procedure(s) Performed: INCISION AND DRAINAGE, ABSCESS     Patient location during evaluation: PACU Anesthesia Type: General Level of consciousness: awake and alert Pain management: pain level controlled Vital Signs Assessment: post-procedure vital signs reviewed and stable Respiratory status: spontaneous breathing, nonlabored ventilation and respiratory function stable Cardiovascular status: stable and blood pressure returned to baseline Anesthetic complications: no   No notable events documented.  Last Vitals:  Vitals:   12/11/23 1445 12/11/23 1457  BP: 117/73 122/75  Pulse: 95 (!) 102  Resp: 17 16  Temp: 36.9 C 37.1 C  SpO2: 97% 99%    Last Pain:  Vitals:   12/11/23 1457  TempSrc: Oral  PainSc:                  Debby FORBES Like

## 2023-12-11 NOTE — Plan of Care (Signed)

## 2023-12-11 NOTE — Anesthesia Preprocedure Evaluation (Addendum)
 Anesthesia Evaluation  Patient identified by MRN, date of birth, ID band Patient awake    Reviewed: Allergy & Precautions, NPO status , Patient's Chart, lab work & pertinent test results  History of Anesthesia Complications Negative for: history of anesthetic complications  Airway Mallampati: II  TM Distance: >3 FB Neck ROM: Full    Dental  (+) Dental Advisory Given, Teeth Intact   Pulmonary neg pulmonary ROS   Pulmonary exam normal        Cardiovascular negative cardio ROS Normal cardiovascular exam     Neuro/Psych negative neurological ROS  negative psych ROS   GI/Hepatic negative GI ROS, Neg liver ROS,,,  Endo/Other  negative endocrine ROS    Renal/GU negative Renal ROS     Musculoskeletal negative musculoskeletal ROS (+)    Abdominal   Peds  Hematology negative hematology ROS (+)   Anesthesia Other Findings   Reproductive/Obstetrics                              Anesthesia Physical Anesthesia Plan  ASA: 1  Anesthesia Plan: General   Post-op Pain Management: Tylenol  PO (pre-op)* and Celebrex  PO (pre-op)*   Induction: Intravenous  PONV Risk Score and Plan: 3 and Treatment may vary due to age or medical condition, Ondansetron , Dexamethasone  and Midazolam   Airway Management Planned: LMA and Oral ETT  Additional Equipment: None  Intra-op Plan:   Post-operative Plan: Extubation in OR  Informed Consent: I have reviewed the patients History and Physical, chart, labs and discussed the procedure including the risks, benefits and alternatives for the proposed anesthesia with the patient or authorized representative who has indicated his/her understanding and acceptance.     Dental advisory given  Plan Discussed with: CRNA and Anesthesiologist  Anesthesia Plan Comments: (ETT only if prone )         Anesthesia Quick Evaluation

## 2023-12-11 NOTE — H&P (Signed)
 Monique [name] is an 38 y.o. female.   Chief Complaint: perirectal pain HPI: 37yo F C/O 1 week HX perirectal pain. She has not had any drainage. She was seen by Puja at [facility] yesterday who tried to I&D. It was not successful and difficult to tolerate for the patient. She was referred to an ED for CT scan. She went to [facility] and CT showed a 3cm perirectal abscess in the medial L buttock. She was accepted in transfer for surgical management. Of note, she underwent I&D of a perianal abscess by Dr. Dasie 7/24.  Past Medical History:  Diagnosis Date   Allergy    Anemia    Hyperlipidemia     Past Surgical History:  Procedure Laterality Date   INCISION AND DRAINAGE PERIRECTAL ABSCESS N/A 11/01/2022   Procedure: IRRIGATION AND DEBRIDEMENT PERIRECTAL ABSCESS;  Surgeon: Dasie Leonor CROME, MD;  Location: [facility] OR;  Service: General;  Laterality: N/A;   NO PAST SURGERIES      Family History  Problem Relation Age of Onset   Miscarriages / Stillbirths Mother    Hypertension Mother    Hyperlipidemia Mother    Hearing loss Father    Alcohol abuse Father    Social History:  reports that she has never smoked. She has never used smokeless tobacco. She reports that she does not drink alcohol and does not use drugs.  Allergies:  Allergies  Allergen Reactions   Rocephin  [Ceftriaxone ] Shortness Of Breath    After IV administration.    Medications Prior to Admission  Medication Sig Dispense Refill   APPLE CIDER VINEGAR PO Take by mouth daily.     MAGNESIUM GLUCONATE PO Take by mouth daily.     Multiple Vitamins-Minerals (WOMENS MULTIVITAMIN PO) Take by mouth daily.     Probiotic Product (PROBIOTIC DAILY PO) Take by mouth daily.     Vitamin D -Vitamin K (VITAMIN K2-VITAMIN D3 PO) Take by mouth daily.      Results for orders placed or performed during the hospital encounter of 12/10/23 (from the past 48 hours)  Comprehensive metabolic panel     Status: Abnormal   Collection Time: 12/10/23   6:02 PM  Result Value Ref Range   Sodium 137 135 - 145 mmol/L   Potassium 3.8 3.5 - 5.1 mmol/L   Chloride 103 98 - 111 mmol/L   CO2 20 (L) 22 - 32 mmol/L   Glucose, Bld 112 (H) 70 - 99 mg/dL    Comment: Glucose reference range applies only to samples taken after fasting for at least 8 hours.   BUN 12 6 - 20 mg/dL   Creatinine, Ser 9.20 0.44 - 1.00 mg/dL   Calcium 9.7 8.9 - 89.6 mg/dL   Total Protein 7.5 6.5 - 8.1 g/dL   Albumin 4.7 3.5 - 5.0 g/dL   AST 17 15 - 41 U/L   ALT 11 0 - 44 U/L   Alkaline Phosphatase 64 38 - 126 U/L   Total Bilirubin 0.5 0.0 - 1.2 mg/dL   GFR, Estimated >39 >39 mL/min    Comment: (NOTE) Calculated using the CKD-EPI Creatinine Equation (2021)    Anion gap 14 5 - 15    Comment: Performed at [facility], [address], [place], [place] [address]  CBC with Differential     Status: Abnormal   Collection Time: 12/10/23  6:02 PM  Result Value Ref Range   WBC 17.1 (H) 4.0 - 10.5 K/uL   RBC 4.32 3.87 - 5.11  MIL/uL   Hemoglobin 13.0 12.0 - 15.0 g/dL   HCT 62.6 63.9 - 53.9 %   MCV 86.3 80.0 - 100.0 fL   MCH 30.1 26.0 - 34.0 pg   MCHC 34.9 30.0 - 36.0 g/dL   RDW 88.0 88.4 - 84.4 %   Platelets 213 150 - 400 K/uL   nRBC 0.0 0.0 - 0.2 %   Neutrophils Relative % 81 %   Neutro Abs 14.0 (H) 1.7 - 7.7 K/uL   Lymphocytes Relative 10 %   Lymphs Abs 1.7 0.7 - 4.0 K/uL   Monocytes Relative 7 %   Monocytes Absolute 1.2 (H) 0.1 - 1.0 K/uL   Eosinophils Relative 1 %   Eosinophils Absolute 0.1 0.0 - 0.5 K/uL   Basophils Relative 0 %   Basophils Absolute 0.1 0.0 - 0.1 K/uL   Immature Granulocytes 1 %   Abs Immature Granulocytes 0.08 (H) 0.00 - 0.07 K/uL    Comment: Performed at [facility], [address] [place], [place] [address]  Urinalysis, Routine w reflex microscopic -Urine, Clean Catch     Status: Abnormal   Collection Time: 12/10/23 11:01 PM  Result Value Ref Range   Color, Urine YELLOW YELLOW   APPearance CLEAR  CLEAR   Specific Gravity, Urine 1.018 1.005 - 1.030   pH 5.5 5.0 - 8.0   Glucose, UA NEGATIVE NEGATIVE mg/dL   Hgb urine dipstick NEGATIVE NEGATIVE   Bilirubin Urine NEGATIVE NEGATIVE   Ketones, ur 40 (A) NEGATIVE mg/dL   Protein, ur NEGATIVE NEGATIVE mg/dL   Nitrite NEGATIVE NEGATIVE   Leukocytes,Ua LARGE (A) NEGATIVE   RBC / HPF 0-5 0 - 5 RBC/hpf   WBC, UA 21-50 0 - 5 WBC/hpf   Bacteria, UA RARE (A) NONE SEEN   Squamous Epithelial / HPF 6-10 0 - 5 /HPF   Mucus PRESENT     Comment: Performed at [facility], [address] [place], [place] [address]  Pregnancy, urine     Status: None   Collection Time: 12/10/23 11:01 PM  Result Value Ref Range   Preg Test, Ur NEGATIVE NEGATIVE    Comment:        THE SENSITIVITY OF THIS METHODOLOGY IS >20 mIU/mL. Performed at [facility], [address] [place], [place] [address]   Lactic acid, plasma     Status: None   Collection Time: 12/10/23 11:57 PM  Result Value Ref Range   Lactic Acid, Venous 0.7 0.5 - 1.9 mmol/L    Comment: Performed at [facility], [address] [place], [place] [address]  Lactic acid, plasma     Status: None   Collection Time: 12/11/23  4:19 AM  Result Value Ref Range   Lactic Acid, Venous 0.7 0.5 - 1.9 mmol/L    Comment: Performed at [facility], [address], [place], [place] [address]  CBC     Status: Abnormal   Collection Time: 12/11/23  4:19 AM  Result Value Ref Range   WBC 15.9 (H) 4.0 - 10.5 K/uL   RBC 4.11 3.87 - 5.11 MIL/uL   Hemoglobin 12.4 12.0 - 15.0 g/dL   HCT 63.8 63.9 - 53.9 %   MCV 87.8 80.0 - 100.0 fL   MCH 30.2 26.0 - 34.0 pg   MCHC 34.3 30.0 - 36.0 g/dL   RDW 87.9 88.4 - 84.4 %   Platelets 183 150 - 400 K/uL   nRBC 0.0 0.0 - 0.2 %    Comment:  Performed at [facility], [address], [place], [place] [address]  Basic metabolic panel     Status: Abnormal   Collection Time: 12/11/23  4:19 AM  Result Value Ref  Range   Sodium 137 135 - 145 mmol/L   Potassium 3.7 3.5 - 5.1 mmol/L   Chloride 106 98 - 111 mmol/L   CO2 22 22 - 32 mmol/L   Glucose, Bld 105 (H) 70 - 99 mg/dL    Comment: Glucose reference range applies only to samples taken after fasting for at least 8 hours.   BUN 6 6 - 20 mg/dL   Creatinine, Ser 9.31 0.44 - 1.00 mg/dL   Calcium 9.0 8.9 - 89.6 mg/dL   GFR, Estimated >39 >39 mL/min    Comment: (NOTE) Calculated using the CKD-EPI Creatinine Equation (2021)    Anion gap 9 5 - 15    Comment: Performed at [facility], [address] [place], [place] [address]   CT ABDOMEN PELVIS W CONTRAST Result Date: 12/10/2023 CLINICAL DATA:  Sepsis with suspected pelvic abscess. EXAM: CT ABDOMEN AND PELVIS WITH CONTRAST TECHNIQUE: Multidetector CT imaging of the abdomen and pelvis was performed using the standard protocol following bolus administration of intravenous contrast. RADIATION DOSE REDUCTION: This exam was performed according to the departmental dose-optimization program which includes automated exposure control, adjustment of the mA and/or kV according to patient size and/or use of iterative reconstruction technique. CONTRAST:  75mL OMNIPAQUE  IOHEXOL  300 MG/ML  SOLN COMPARISON:  CTs with IV contrast 11/01/2022, 02/11/2017. FINDINGS: Lower chest: No acute abnormality. Hepatobiliary: No focal liver abnormality is seen. No gallstones, gallbladder wall thickening, or biliary dilatation. Pancreas: No abnormality. Spleen: No abnormality. Adrenals/Urinary Tract: Adrenal glands are unremarkable. Kidneys are normal, without renal calculi, focal lesion, or hydronephrosis. Bladder is unremarkable. Stomach/Bowel: No dilatation or wall thickening including the retrocecal appendix. Vascular/Lymphatic: No significant vascular findings are present. No enlarged abdominal or pelvic lymph nodes. Bilateral borderline prominent inguinal chain nodes are similar to the last CT. Reproductive: The uterus is intact.  Ovaries are stable in size and configuration. There is a chronic thin walled fluid homogeneous collection in the cul-de-sac space eccentric to the left measuring 6.4 x 3.5 cm, not significantly changed, Hounsfield density is 18. This would either represent a paraovarian cyst or an intestinal duplication cyst as there's abutment with both the sigmoid colon and rectum. There have been no significant interval changes. Other: In the lower medial left buttock there is an irregular thick walled perianal rim enhancing air and fluid collection consistent with an abscess noted in the subcutaneous plane and measuring 2.6 x 2.7 by 3.1 cm. Similar findings were noted on the last CT. Adjacent inflammatory stranding extends down into the perineum but there is no evidence of an acute necrotizing process. No gas is seen in the soft tissues. The inflammatory changes do not appear to involve the pelvis proper. There is no free fluid, free hemorrhage or free air, no incarcerated hernia. Small umbilical fat hernia. Musculoskeletal: No acute or significant osseous findings. IMPRESSION: 1. 2.6 x 2.7 x 3.1 cm subcutaneous perianal abscess in the lower medial left buttock, with adjacent inflammatory stranding extending down into the perineum but no evidence of an acute necrotizing process. 2. No acute intra-abdominal or intrapelvic abnormality. Stable borderline prominent inguinal chain nodes. 3. 6.4 x 3.5 cm thin walled fluid collection in the cul-de-sac space eccentric to the left. This would either represent a paraovarian cyst or an intestinal duplication cyst  as there's abutment with both the sigmoid colon and rectum. There have been no significant interval changes. 4. Small umbilical fat hernia. Electronically Signed   By: Francis Quam M.D.   On: 12/10/2023 23:35    Review of Systems  Constitutional:  Negative for chills.  HENT: Negative.    Eyes: Negative.   Respiratory: Negative.    Cardiovascular: Negative.    Gastrointestinal:  Positive for rectal pain. Negative for blood in stool.  Endocrine: Negative.   Genitourinary: Negative.   Musculoskeletal: Negative.   Skin: Negative.   Allergic/Immunologic: Negative.   Neurological: Negative.   Hematological: Negative.   Psychiatric/Behavioral: Negative.      Blood pressure (!) 140/80, pulse 100, temperature 98.6 F (37 C), temperature source Oral, resp. rate 18, height 5' 5 (1.651 m), weight 70.3 kg, last menstrual period 11/18/2023, SpO2 100%. Physical Exam HENT:     Mouth/Throat:     Mouth: Mucous membranes are moist.  Cardiovascular:     Rate and Rhythm: Normal rate and regular rhythm.  Pulmonary:     Effort: Pulmonary effort is normal.     Breath sounds: No wheezing.  Abdominal:     General: There is no distension.     Palpations: Abdomen is soft.     Tenderness: There is no abdominal tenderness.  Genitourinary:    Comments: Very tender fluctuant area L medial buttock near anus, I&D site Skin:    General: Skin is warm.  Neurological:     Mental Status: She is alert and oriented to person, place, and time.  Psychiatric:        Mood and Affect: Mood normal.      Assessment/Plan Perirectal abscess - for I&D in the OR by Dr. Dasie this AM. I discussed the procedure, risks, and benefits. I discussed possible causes for this including fistula in ano. I answered her questions. She agrees. She received vanc and Zosyn  at [facility]. Continuing Zosyn  here.  Dann FORBES Hummer, MD 12/11/2023, 6:29 AM

## 2023-12-11 NOTE — Plan of Care (Signed)

## 2023-12-11 NOTE — Transfer of Care (Signed)
 Immediate Anesthesia Transfer of Care Note  Patient: Monique [name]  Procedure(s) Performed: INCISION AND DRAINAGE, ABSCESS  Patient Location: PACU  Anesthesia Type:General  Level of Consciousness: drowsy  Airway & Oxygen Therapy: Patient Spontanous Breathing and Patient connected to face mask oxygen  Post-op Assessment: Report given to RN and Post -op Vital signs reviewed and stable  Post vital signs: Reviewed and stable  Last Vitals:  Vitals Value Taken Time  BP 114/61 12/11/23 14:07  Temp 37.1 C 12/11/23 14:07  Pulse 117 12/11/23 14:10  Resp 12 12/11/23 14:10  SpO2 100 % 12/11/23 14:10  Vitals shown include unfiled device data.  Last Pain:  Vitals:   12/11/23 1145  TempSrc: Oral  PainSc:          Complications: No notable events documented.

## 2023-12-11 NOTE — Anesthesia Procedure Notes (Signed)
 Procedure Name: LMA Insertion Date/Time: 12/11/2023 1:27 PM  Performed by: Tatiyanna Lashley J, CRNAPre-anesthesia Checklist: Patient identified, Emergency Drugs available, Suction available and Patient being monitored Patient Re-evaluated:Patient Re-evaluated prior to induction Oxygen Delivery Method: Circle System Utilized Preoxygenation: Pre-oxygenation with 100% oxygen Induction Type: IV induction Ventilation: Mask ventilation without difficulty LMA: LMA inserted LMA Size: 4.0 Number of attempts: 1 Airway Equipment and Method: Bite block Placement Confirmation: positive ETCO2 Tube secured with: Tape Dental Injury: Teeth and Oropharynx as per pre-operative assessment

## 2023-12-11 NOTE — Progress Notes (Signed)
 Monique [name] to be D/C'd  per MD order.  Discussed with the patient and all questions fully answered.  VSS, Skin clean, dry and intact without evidence of skin break down, no evidence of skin tears noted.  IV catheter discontinued intact. Site without signs and symptoms of complications. Dressing and pressure applied.  An After Visit Summary was printed and given to the patient. Patient received prescription.  D/c education completed with patient/family including follow up instructions, medication list, d/c activities limitations if indicated, with other d/c instructions as indicated by MD - patient able to verbalize understanding, all questions fully answered.   Patient instructed to return to ED, call 911, or call MD for any changes in condition.   Patient to be escorted via WC, and D/C home via private auto.

## 2023-12-11 NOTE — Op Note (Signed)
 Date: 12/11/23  Patient: Monique [name] MRN: [id number]  Preoperative Diagnosis: Perianal abscess Postoperative Diagnosis: Same  Procedure: Rectal exam under anesthesia, incision and drainage of perianal abscess  Surgeon: Leonor Dawn, MD  EBL: Minimal  Anesthesia: General LMA  Specimens: Perianal abscess (for culture)  Indications: [name] is a 38 yo female who presented about one year ago with a perianal abscess, for which she underwent an I&D. She recently began having recurrent perianal pain and swelling, and I&D in the office was not successful. She was referred to the ED for a CT scan, which confirmed a perianal abscess extending to the left gluteus. After a discussion of the risks and benefits of surgery, she agreed to proceed with incision and drainage in the operating room.  Findings: Perianal/left gluteal abscess tracking toward the anal canal, highly suspicious for an underlying fistula.  Procedure details: Informed consent was obtained in the preoperative area prior to the procedure. The patient was brought to the operating room, general anesthesia was induced and she was placed in the lithotomy position. The patient was prepped and draped in the usual sterile fashion. A pre-procedure timeout was taken verifying patient identity, surgical site and procedure to be performed.  A digital rectal exam was performed. There were nonthrombosed external hemorrhoids. On digital exam there was fluctuant mass palpable to the left of the anal canal, extending to the left gluteus. The previous I&D incision from yesterday was extended laterally onto the area of fluctuance, and there was immediate return of purulent fluid. The wound was swabbed for cultures. There was some arterial bleeding from the wound edges, which was controlled with cautery and manual pressure. The abscess cavity was probed and tracked medially to the anal canal. A retractor was placed in the anal canal, but no obvious  internal opening could be visualized. The wound was irrigated and infiltrated with 0.25% bupivicaine with epinephrine . A bulky gauze dressing was applied.  The patient tolerated the procedure well with no apparent complications. The patient was extubated and taken to PACU in stable condition.  Leonor Dawn, MD 12/11/23 1:52 PM

## 2023-12-11 NOTE — Interval H&P Note (Signed)
 History and Physical Interval Note:  12/11/2023 12:06 PM  Monique [name]  has presented today for surgery, with the diagnosis of PERI- RECTAL ABSCESS.  The various methods of treatment have been discussed with the patient and family. After consideration of risks, benefits and other options for treatment, the patient has consented to  Procedure(s): INCISION AND DRAINAGE, ABSCESS (N/A) as a surgical intervention.  The patient's history has been reviewed, patient examined, no change in status, stable for surgery.  I have reviewed the patient's chart and labs.  Questions were answered to the patient's satisfaction.  This is her second abscess in the same location, and she reports her grandmother has Crohn's disease. There is high suspicion for an underlying fistula. Will refer for colonoscopy as outpatient to exclude IBD. I discussed this with the patient. All questions were answered.   Leonor LITTIE Dawn

## 2023-12-11 NOTE — Discharge Instructions (Signed)
 [facility] DISCHARGE INSTRUCTIONS  Activity Do not drive while taking narcotic pain medication. You may drive when you are no longer taking prescription pain medication, you can comfortably wear a seatbelt, and you can safely maneuver your car and apply brakes.  Wound Care Your wound is open and will close over the next few weeks from the inside out. It is normal to have some drainage as the wound is healing. If you notice a lot of bleeding, or excessive foul-smelling drainage from the wound, please call the office. Keep your wound covered with clean gauze. Change this at least twice a day and more often as needed if the gauze becomes saturated. Sit in a few inches of warm water 2-3 times a day and after bowel movements to help keep your wound clean as it is healing.  Medications A  prescription for pain medication may be given to you upon discharge.  Take your pain medication as prescribed, if needed.  If narcotic pain medicine is not needed, then you may take acetaminophen  (Tylenol ) or ibuprofen (Advil) as needed. It is common to experience some constipation if taking pain medication after surgery.  Increasing fluid intake and taking a stool softener (such as Colace) will usually help or prevent this problem from occurring.  A mild laxative (Milk of Magnesia or Miralax) should be taken according to package directions if there are no bowel movements after 48 hours. Take your usually prescribed medications unless otherwise directed. If you need a refill on your pain medication, please contact your pharmacy.  They will contact our office to request authorization. Prescriptions will not be filled after 5 pm or on weekends.  When to Call Us : Fever greater than 100.5 New redness, drainage, or swelling at incision site Severe pain, nausea, or vomiting Persistent bleeding from incisions  Follow-up You will have an appointment with Dr. Dasie in about 2 weeks - our office will call  you on Monday to schedule this, or you may call the number below. This will be at the [facility] office at [address], [place], [place]. Please arrive at least 15 minutes prior to your scheduled appointment time.  The clinic staff is available to answer your questions during regular business hours.  Please don't hesitate to call and ask to speak to one of the nurses for clinical concerns.  If you have a medical emergency, go to the nearest emergency room or call 911.  A surgeon from [facility] is always on call at the hospital  [address], [address], [place], [place]  [address] ?  [address] [address], [place], [place]   [address] [phone] ? Toll Free: [phone] ? FAX [phone] Web site: [email or web address]      Managing Your Pain After Surgery Without Opioids    Thank you for participating in our program to help patients manage their pain after surgery without opioids. This is part of our effort to provide you with the best care possible, without exposing you or your family to the risk that opioids pose.  What pain can I expect after surgery? You can expect to have some pain after surgery. This is normal. The pain is typically worse the day after surgery, and quickly begins to get better. Many studies have found that many patients are able to manage their pain after surgery with Over-the-Counter (OTC) medications such as Tylenol  and Motrin. If you have a condition that does not allow you to take  Tylenol  or Motrin, notify your surgical team.  How will I manage my pain? The best strategy for controlling your pain after surgery is around the clock pain control with Tylenol  (acetaminophen ) and Motrin (ibuprofen or Advil). Alternating these medications with each other allows you to maximize your pain control. In addition to Tylenol  and Motrin, you can use heating pads or ice packs on your incisions to help reduce your  pain.  How will I alternate your regular strength over-the-counter pain medication? You will take a dose of pain medication every three hours. Start by taking 650 mg of Tylenol  (2 pills of 325 mg) 3 hours later take 600 mg of Motrin (3 pills of 200 mg) 3 hours after taking the Motrin take 650 mg of Tylenol  3 hours after that take 600 mg of Motrin.   - 1 -  See example - if your first dose of Tylenol  is at 12:00 PM   12:00 PM Tylenol  650 mg (2 pills of 325 mg)  3:00 PM Motrin 600 mg (3 pills of 200 mg)  6:00 PM Tylenol  650 mg (2 pills of 325 mg)  9:00 PM Motrin 600 mg (3 pills of 200 mg)  Continue alternating every 3 hours   We recommend that you follow this schedule around-the-clock for at least 3 days after surgery, or until you feel that it is no longer needed. Use the table on the last page of this handout to keep track of the medications you are taking. Important: Do not take more than 3000mg  of Tylenol  or 3200mg  of Motrin in a 24-hour period. Do not take ibuprofen/Motrin if you have a history of bleeding stomach ulcers, severe kidney disease, &/or actively taking a blood thinner  What if I still have pain? If you have pain that is not controlled with the over-the-counter pain medications (Tylenol  and Motrin or Advil) you might have what we call "breakthrough" pain. You will receive a prescription for a small amount of an opioid pain medication such as Oxycodone , Tramadol , or Tylenol  with Codeine. Use these opioid pills in the first 24 hours after surgery if you have breakthrough pain. Do not take more than 1 pill every 4-6 hours.  If you still have uncontrolled pain after using all opioid pills, don't hesitate to call our staff using the number provided. We will help make sure you are managing your pain in the best way possible, and if necessary, we can provide a prescription for additional pain medication.   Day 1    Time  Name of Medication Number of pills taken  Amount of  Acetaminophen   Pain Level   Comments  AM PM       AM PM       AM PM       AM PM       AM PM       AM PM       AM PM       AM PM       Total Daily amount of Acetaminophen  Do not take more than  3,000 mg per day      Day 2    Time  Name of Medication Number of pills taken  Amount of Acetaminophen   Pain Level   Comments  AM PM       AM PM       AM PM       AM PM       AM PM  AM PM       AM PM       AM PM       Total Daily amount of Acetaminophen  Do not take more than  3,000 mg per day      Day 3    Time  Name of Medication Number of pills taken  Amount of Acetaminophen   Pain Level   Comments  AM PM       AM PM       AM PM       AM PM         AM PM       AM PM       AM PM       AM PM       Total Daily amount of Acetaminophen  Do not take more than  3,000 mg per day      Day 4    Time  Name of Medication Number of pills taken  Amount of Acetaminophen   Pain Level   Comments  AM PM       AM PM       AM PM       AM PM       AM PM       AM PM       AM PM       AM PM       Total Daily amount of Acetaminophen  Do not take more than  3,000 mg per day      Day 5    Time  Name of Medication Number of pills taken  Amount of Acetaminophen   Pain Level   Comments  AM PM       AM PM       AM PM       AM PM       AM PM       AM PM       AM PM       AM PM       Total Daily amount of Acetaminophen  Do not take more than  3,000 mg per day      Day 6    Time  Name of Medication Number of pills taken  Amount of Acetaminophen   Pain Level  Comments  AM PM       AM PM       AM PM       AM PM       AM PM       AM PM       AM PM       AM PM       Total Daily amount of Acetaminophen  Do not take more than  3,000 mg per day      Day 7    Time  Name of Medication Number of pills taken  Amount of Acetaminophen   Pain Level   Comments  AM PM       AM PM       AM PM       AM PM       AM PM       AM PM       AM PM        AM PM       Total Daily amount of Acetaminophen  Do not take more than  3,000 mg per day        For additional information about how and where  to safely dispose of unused opioid medications - [email or web address]  Disclaimer: This document contains information and/or instructional materials adapted from [facility] for the typical patient with your condition. It does not replace medical advice from your health care provider because your experience may differ from that of the typical patient. Talk to your health care provider if you have any questions about this document, your condition or your treatment plan. Adapted from [facility]

## 2023-12-13 ENCOUNTER — Encounter (HOSPITAL_COMMUNITY): Payer: Self-pay | Admitting: Surgery

## 2023-12-14 LAB — AEROBIC/ANAEROBIC CULTURE W GRAM STAIN (SURGICAL/DEEP WOUND)

## 2023-12-16 LAB — CULTURE, BLOOD (ROUTINE X 2)
Culture: NO GROWTH
Culture: NO GROWTH

## 2023-12-21 DIAGNOSIS — Z0120 Encounter for dental examination and cleaning without abnormal findings: Secondary | ICD-10-CM | POA: Diagnosis not present

## 2023-12-21 NOTE — Discharge Summary (Signed)
 Physician Discharge Summary   Patient ID: Monique [name] [id number] 38 y.o. 1985-04-27  Admit date: 12/10/2023  Discharge date and time: 12/11/2023  6:46 PM   Admitting Physician: Dann Hummer, MD   Discharge Physician: Leonor Dawn, MD  Admission Diagnoses: Perianal abscess [K61.0] Perirectal abscess [K61.1]  Discharge Diagnoses: Perianal abscess  Admission Condition: stable  Discharged Condition: good  Indication for Admission: Monique [name] is a 38 yo female with a previous history of perianal abscess, for which she underwent an I&D about a year ago. She presented with recurrent perianal pain, and bedside I&D in the office was not successful. She was sent to the ED, and a CT scan showed a recurrent perianal abscess in the same location as her prior abscess.  Hospital Course: The patient was admitted early in the morning of 9/6 and started on antibiotics. She was taken to the OR on 9/6 for a rectal EUA and I&D of a perianal abscess. Please see separately dictated operative report for further details. Postoperatively her diet was advanced and she remained in stable condition. She was discharged home on the evening of her surgery.   Consults: None  Significant Diagnostic Studies: radiology:  CT scan 9/5: IMPRESSION: 1. 2.6 x 2.7 x 3.1 cm subcutaneous perianal abscess in the lower medial left buttock, with adjacent inflammatory stranding extending down into the perineum but no evidence of an acute necrotizing process. 2. No acute intra-abdominal or intrapelvic abnormality. Stable borderline prominent inguinal chain nodes. 3. 6.4 x 3.5 cm thin walled fluid collection in the cul-de-sac space eccentric to the left. This would either represent a paraovarian cyst or an intestinal duplication cyst as there's abutment with both the sigmoid colon and rectum. There have been no significant interval changes. 4. Small umbilical fat hernia.  Treatments: antibiotics: Zosyn   Discharge  Exam: General: resting comfortably, NAD Neuro: alert and oriented Resp: nonlabored respirations on room air I&D site left gluteus/perianal area  Disposition: Discharge disposition: 01-Home or Self Care       Patient Instructions:  Allergies as of 12/11/2023       Reactions   Rocephin  [ceftriaxone ] Shortness Of Breath   After IV administration.        Medication List     STOP taking these medications    amoxicillin -clavulanate 875-125 MG tablet Commonly known as: AUGMENTIN        TAKE these medications    acetaminophen  500 MG tablet Commonly known as: TYLENOL  Take 2 tablets (1,000 mg total) by mouth every 8 (eight) hours as needed for mild pain (pain score 1-3).   APPLE CIDER VINEGAR PO Take by mouth daily.   hydrocortisone 2.5 % cream Apply 1 Application topically 2 (two) times daily.   MAGNESIUM GLUCONATE PO Take by mouth daily.   PROBIOTIC DAILY PO Take by mouth daily.   VITAMIN K2-VITAMIN D3 PO Take by mouth daily.   WOMENS MULTIVITAMIN PO Take by mouth daily.       ASK your doctor about these medications    traMADol  50 MG tablet Commonly known as: Ultram  Take 1 tablet (50 mg total) by mouth every 6 (six) hours as needed for up to 3 days for severe pain (pain score 7-10). Ask about: Should I take this medication?       Activity: no driving while on analgesics Diet: regular diet Wound Care: Cover wound with dry gauze, perform sitz baths TID and after bowel movements  Follow-up with Dr. Dawn in 2 weeks. Will discuss pelvic cyst at follow  up noted on imaging.  Signed: Leonor LITTIE Dawn 12/21/2023 10:33 AM

## 2024-01-18 ENCOUNTER — Encounter: Payer: Self-pay | Admitting: General Practice

## 2024-01-18 ENCOUNTER — Other Ambulatory Visit: Payer: Self-pay | Admitting: General Practice

## 2024-01-18 DIAGNOSIS — K611 Rectal abscess: Secondary | ICD-10-CM

## 2024-01-19 DIAGNOSIS — K603 Anal fistula, unspecified: Secondary | ICD-10-CM | POA: Diagnosis not present

## 2024-02-03 DIAGNOSIS — D2261 Melanocytic nevi of right upper limb, including shoulder: Secondary | ICD-10-CM | POA: Diagnosis not present

## 2024-02-03 DIAGNOSIS — D225 Melanocytic nevi of trunk: Secondary | ICD-10-CM | POA: Diagnosis not present

## 2024-02-03 DIAGNOSIS — D2262 Melanocytic nevi of left upper limb, including shoulder: Secondary | ICD-10-CM | POA: Diagnosis not present

## 2024-02-03 DIAGNOSIS — L821 Other seborrheic keratosis: Secondary | ICD-10-CM | POA: Diagnosis not present

## 2024-02-03 DIAGNOSIS — D485 Neoplasm of uncertain behavior of skin: Secondary | ICD-10-CM | POA: Diagnosis not present

## 2024-02-03 DIAGNOSIS — C4441 Basal cell carcinoma of skin of scalp and neck: Secondary | ICD-10-CM | POA: Diagnosis not present

## 2024-02-03 DIAGNOSIS — D2271 Melanocytic nevi of right lower limb, including hip: Secondary | ICD-10-CM | POA: Diagnosis not present

## 2024-02-03 DIAGNOSIS — L820 Inflamed seborrheic keratosis: Secondary | ICD-10-CM | POA: Diagnosis not present

## 2024-02-03 DIAGNOSIS — D2272 Melanocytic nevi of left lower limb, including hip: Secondary | ICD-10-CM | POA: Diagnosis not present

## 2024-02-03 DIAGNOSIS — D2372 Other benign neoplasm of skin of left lower limb, including hip: Secondary | ICD-10-CM | POA: Diagnosis not present

## 2024-02-15 ENCOUNTER — Telehealth: Payer: Self-pay

## 2024-02-15 NOTE — Telephone Encounter (Signed)
 Spoke with patient. New patient appt with Dr. Suzann on 03/08/24 at 11:10 am. Provided patient with office address.

## 2024-03-07 NOTE — Progress Notes (Unsigned)
 [facility] Initial Consultation   Referring Provider Vincente Shivers, NP [address] [place],  [place] [address]  Primary Care Provider Vincente Shivers, NP  Patient Profile: Monique [name] is a 38 y.o. female who is seen in consultation in the [facility] at the request of Dr. Vincente for evaluation and management of the problem(s) noted below.  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 female with a history of ***.  Discussed the use of AI scribe software for clinical note transcription with the patient, who gave verbal consent to proceed.  History of Present Illness Monique [name] is a 38 year old female with a past medical history noteworthy for hypercholesterol anemia, perianal abscess, vitamin D  deficiency     GI Review of Symptoms Significant for {[id number]}. Otherwise negative.  General Review of Systems  Review of systems is significant for the pertinent positives and negatives as listed per the HPI.  Full ROS is otherwise negative.  Past Medical History   Past Medical History:  Diagnosis Date   Allergy    Anemia    with pregnancy   Hyperlipidemia      Past Surgical History   Past Surgical History:  Procedure Laterality Date   INCISION AND DRAINAGE ABSCESS N/A 12/11/2023   Procedure: INCISION AND DRAINAGE, ABSCESS;  Surgeon: Dasie Leonor CROME, MD;  Location: [facility] OR;  Service: General;  Laterality: N/A;   INCISION AND DRAINAGE PERIRECTAL ABSCESS N/A 11/01/2022   Procedure: IRRIGATION AND DEBRIDEMENT PERIRECTAL ABSCESS;  Surgeon: Dasie Leonor CROME, MD;  Location: [facility] OR;  Service: General;  Laterality: N/A;     Allergies and Medications   Allergies  Allergen Reactions   Rocephin  [Ceftriaxone ] Shortness Of Breath    After IV administration.    @MEDSTODAY @  Family History    Family History  Problem Relation Age of Onset   Miscarriages / Stillbirths Mother    Hypertension Mother    Hyperlipidemia Mother    Hearing loss Father    Alcohol abuse Father      Social History   Social History   Tobacco Use   Smoking status: Never   Smokeless tobacco: Never  Vaping Use   Vaping status: Never Used  Substance Use Topics   Alcohol use: No   Drug use: No   [name] reports that she has never smoked. She has never used smokeless tobacco. She reports that she does not drink alcohol and does not use drugs.  Vital Signs and Physical Examination   There were no vitals filed for this visit. There is no height or weight on file to calculate BMI.    General: Well developed, well nourished, no acute distress Head: Normocephalic and atraumatic Eyes: Sclerae anicteric, EOMI Ears: Normal auditory acuity Mouth: No deformities or lesions noted Lungs: Clear throughout to auscultation Heart: Regular rate and rhythm; No murmurs, rubs or bruits Abdomen: Soft, non tender and non distended. No masses, hepatosplenomegaly or hernias noted. Normal Bowel sounds Rectal: Musculoskeletal: Symmetrical with no gross deformities  Pulses:  Normal pulses noted Extremities: No edema or deformities noted Neurological: Alert oriented x 4, grossly nonfocal Psychological:  Alert and cooperative. Normal mood and affect  Review of Data  The following data was reviewed at the time of this encounter:  Laboratory Studies      Latest Ref Rng & Units 12/11/2023    4:19  AM 12/10/2023    6:02 PM 11/26/2023   11:26 AM  CBC  WBC 4.0 - 10.5 K/uL 15.9  17.1  4.8   Hemoglobin 12.0 - 15.0 g/dL 87.5  86.9  86.4   Hematocrit 36.0 - 46.0 % 36.1  37.3  40.0   Platelets 150 - 400 K/uL 183  213  223.0     No results found for: LIPASE    Latest Ref Rng & Units 12/11/2023    4:19 AM 12/10/2023    6:02 PM 11/26/2023   11:26 AM  CMP  Glucose 70 - 99 mg/dL 894  887  86   BUN 6 - 20 mg/dL 6  12  13     Creatinine 0.44 - 1.00 mg/dL 9.31  9.20  9.31   Sodium 135 - 145 mmol/L 137  137  139   Potassium 3.5 - 5.1 mmol/L 3.7  3.8  4.2   Chloride 98 - 111 mmol/L 106  103  103   CO2 22 - 32 mmol/L 22  20  29    Calcium 8.9 - 10.3 mg/dL 9.0  9.7  9.2   Total Protein 6.5 - 8.1 g/dL  7.5  7.0   Total Bilirubin 0.0 - 1.2 mg/dL  0.5  0.8   Alkaline Phos 38 - 126 U/L  64  46   AST 15 - 41 U/L  17  12   ALT 0 - 44 U/L  11  11      Imaging Studies  CTAP 12/10/2023 1. 2.6 x 2.7 x 3.1 cm subcutaneous perianal abscess in the lower medial left buttock, with adjacent inflammatory stranding extending down into the perineum but no evidence of an acute necrotizing process. 2. No acute intra-abdominal or intrapelvic abnormality. Stable borderline prominent inguinal chain nodes. 3. 6.4 x 3.5 cm thin walled fluid collection in the cul-de-sac space eccentric to the left. This would either represent a paraovarian cyst or an intestinal duplication cyst as there's abutment with both the sigmoid colon and rectum. There have been no significant interval changes. 4. Small umbilical fat hernia.  CTAP 11/01/2022 1. Positive for perianal abscess in the superficial subcutaneous fat of the medial left buttock measuring approximately 2.6 x 1.8 cm. No extension into the ischial rectal fossa. 2. Similar appearance of circumscribed nearly 6 cm cyst affiliated with the left ovary compared to prior imaging from 2018. Stability over nearly 6 years suggests a benign process.  CTAP 02/11/2017 6 cm benign-appearing cystic lesion in pelvic cul-de-sac. Recommend followup by pelvic ultrasound in 12 weeks. This recommendation follows ACR consensus guidelines: White Paper of the ACR Incidental Findings Committee II on Adnexal Findings. J Am Coll Radiol [phone].  GI Procedures and Studies  None   Clinical Impression  It is my clinical impression that Monique [name] is a 38 y.o. female with;  ***  Plan   *** *** *** *** ***  Planned Follow Up No follow-ups on file.  The patient or caregiver verbalized understanding of the material covered, with no barriers to understanding. All questions were answered. Patient or caregiver is agreeable with the plan outlined above.    It was a pleasure to see Dennis.  If you have any questions or concerns regarding this evaluation, do not hesitate to contact me.  Inocente Hausen, MD [facility] [place]

## 2024-03-08 ENCOUNTER — Ambulatory Visit: Admitting: Pediatrics

## 2024-03-08 ENCOUNTER — Encounter: Payer: Self-pay | Admitting: Pediatrics

## 2024-03-08 ENCOUNTER — Other Ambulatory Visit

## 2024-03-08 VITALS — BP 110/70 | HR 75 | Ht 65.0 in | Wt 152.0 lb

## 2024-03-08 DIAGNOSIS — K610 Anal abscess: Secondary | ICD-10-CM

## 2024-03-08 DIAGNOSIS — K644 Residual hemorrhoidal skin tags: Secondary | ICD-10-CM | POA: Diagnosis not present

## 2024-03-08 DIAGNOSIS — Z8719 Personal history of other diseases of the digestive system: Secondary | ICD-10-CM | POA: Diagnosis not present

## 2024-03-08 DIAGNOSIS — K603 Anal fistula, unspecified: Secondary | ICD-10-CM

## 2024-03-08 DIAGNOSIS — Z8379 Family history of other diseases of the digestive system: Secondary | ICD-10-CM

## 2024-03-08 LAB — CBC WITH DIFFERENTIAL/PLATELET
Basophils Absolute: 0.1 K/uL (ref 0.0–0.1)
Basophils Relative: 0.9 % (ref 0.0–3.0)
Eosinophils Absolute: 0.1 K/uL (ref 0.0–0.7)
Eosinophils Relative: 2.2 % (ref 0.0–5.0)
HCT: 40.2 % (ref 36.0–46.0)
Hemoglobin: 13.9 g/dL (ref 12.0–15.0)
Lymphocytes Relative: 28.6 % (ref 12.0–46.0)
Lymphs Abs: 1.9 K/uL (ref 0.7–4.0)
MCHC: 34.6 g/dL (ref 30.0–36.0)
MCV: 87.2 fl (ref 78.0–100.0)
Monocytes Absolute: 0.6 K/uL (ref 0.1–1.0)
Monocytes Relative: 8.2 % (ref 3.0–12.0)
Neutro Abs: 4.1 K/uL (ref 1.4–7.7)
Neutrophils Relative %: 60.1 % (ref 43.0–77.0)
Platelets: 223 K/uL (ref 150.0–400.0)
RBC: 4.62 Mil/uL (ref 3.87–5.11)
RDW: 12.4 % (ref 11.5–15.5)
WBC: 6.8 K/uL (ref 4.0–10.5)

## 2024-03-08 LAB — COMPREHENSIVE METABOLIC PANEL WITH GFR
ALT: 13 U/L (ref 0–35)
AST: 14 U/L (ref 0–37)
Albumin: 5.1 g/dL (ref 3.5–5.2)
Alkaline Phosphatase: 56 U/L (ref 39–117)
BUN: 9 mg/dL (ref 6–23)
CO2: 28 meq/L (ref 19–32)
Calcium: 10 mg/dL (ref 8.4–10.5)
Chloride: 101 meq/L (ref 96–112)
Creatinine, Ser: 0.65 mg/dL (ref 0.40–1.20)
GFR: 111.83 mL/min (ref >60.00)
Glucose, Bld: 86 mg/dL (ref 70–99)
Potassium: 4.5 meq/L (ref 3.5–5.1)
Sodium: 138 meq/L (ref 135–145)
Total Bilirubin: 0.8 mg/dL (ref 0.2–1.2)
Total Protein: 7.6 g/dL (ref 6.0–8.3)

## 2024-03-08 LAB — IBC + FERRITIN
Ferritin: 27 ng/mL (ref 10.0–291.0)
Iron: 93 ug/dL (ref 42–145)
Saturation Ratios: 23.4 % (ref 20.0–50.0)
TIBC: 397.6 ug/dL (ref 250.0–450.0)
Transferrin: 284 mg/dL (ref 212.0–360.0)

## 2024-03-08 LAB — B12 AND FOLATE PANEL
Folate: 12.4 ng/mL (ref >5.9)
Vitamin B-12: 744 pg/mL (ref 211–911)

## 2024-03-08 LAB — C-REACTIVE PROTEIN: CRP: <0.5 mg/dL (ref 0.5–20.0)

## 2024-03-08 LAB — SEDIMENTATION RATE: Sed Rate: 5 mm/h (ref 0–20)

## 2024-03-08 LAB — VITAMIN D 25 HYDROXY (VIT D DEFICIENCY, FRACTURES): VITD: 55.56 ng/mL (ref 30.00–100.00)

## 2024-03-08 MED ORDER — AMOXICILLIN-POT CLAVULANATE 875-125 MG PO TABS: 1.0000 | ORAL_TABLET | Freq: Two times a day (BID) | ORAL | 0 refills | Status: AC

## 2024-03-08 MED ORDER — NA SULFATE-K SULFATE-MG SULF 17.5-3.13-1.6 GM/177ML PO SOLN: 1.0000 | Freq: Once | ORAL | 0 refills | Status: AC

## 2024-03-08 NOTE — Patient Instructions (Addendum)
 Your provider has requested that you go to the basement level for lab work before leaving today. Press B on the elevator. The lab is located at the first door on the left as you exit the elevator.  Due to recent changes in healthcare laws, you may see the results of your imaging and laboratory studies on MyChart before your provider has had a chance to review them.  We understand that in some cases there may be results that are confusing or concerning to you. Not all laboratory results come back in the same time frame and the provider may be waiting for multiple results in order to interpret others.  Please give us  48 hours in order for your provider to thoroughly review all the results before contacting the office for clarification of your results.   We have sent the following medications to your pharmacy for you to pick up at your convenience: Augmentin  875/125 mg, take 1 tablet twice a day for 10 days.   You have been scheduled for a colonoscopy. Please follow written instructions given to you at your visit today.   If you use inhalers (even only as needed), please bring them with you on the day of your procedure.  DO NOT TAKE 7 DAYS PRIOR TO TEST- Trulicity (dulaglutide) Ozempic, Wegovy (semaglutide) Mounjaro, Zepbound (tirzepatide) Bydureon Bcise (exanatide extended release)  DO NOT TAKE 1 DAY PRIOR TO YOUR TEST Rybelsus (semaglutide) Adlyxin (lixisenatide) Victoza (liraglutide) Byetta (exanatide) ___________________________________________________________________________   Thank you for entrusting me with your care and for choosing [facility], Dr. Inocente Hausen  _______________________________________________________  If your blood pressure at your visit was 140/90 or greater, please contact your primary care physician to follow up on this.  _______________________________________________________  If you are age 38 or older, your body mass index should be between  23-30. Your Body mass index is 25.29 kg/m. If this is out of the aforementioned range listed, please consider follow up with your Primary Care Provider.  If you are age 28 or younger, your body mass index should be between 19-25. Your Body mass index is 25.29 kg/m. If this is out of the aformentioned range listed, please consider follow up with your Primary Care Provider.   ________________________________________________________  The [facility] providers would like to encourage you to use MYCHART to communicate with providers for non-urgent requests or questions.  Due to long hold times on the telephone, sending your provider a message by [facility] may be a faster and more efficient way to get a response.  Please allow 48 business hours for a response.  Please remember that this is for non-urgent requests.  _______________________________________________________  [facility] is using a team-based approach to care.  Your team is made up of your doctor and two to three APPS. Our APPS (Nurse Practitioners and Physician Assistants) work with your physician to ensure care continuity for you. They are fully qualified to address your health concerns and develop a treatment plan. They communicate directly with your gastroenterologist to care for you. Seeing the Advanced Practice Practitioners on your physician's team can help you by facilitating care more promptly, often allowing for earlier appointments, access to diagnostic testing, procedures, and other specialty referrals.

## 2024-03-09 ENCOUNTER — Ambulatory Visit: Payer: Self-pay | Admitting: Pediatrics

## 2024-03-10 DIAGNOSIS — C4441 Basal cell carcinoma of skin of scalp and neck: Secondary | ICD-10-CM | POA: Diagnosis not present

## 2024-04-11 NOTE — Progress Notes (Unsigned)
 [facility] History and Physical   Primary Care Physician:  Vincente Shivers, NP   Reason for Procedure:  Recurrent perianal abscess, fistula in ano, family history of Crohn's disease and rectal cancer  Plan:    Colonoscopy   The patient was provided an opportunity to ask questions and all were answered. The patient agreed with the plan.   HPI: Monique [name] is a 39 y.o. female undergoing colonoscopy for investigation of recurrent perianal abscess, fistula in ano and family history of Crohn's disease and rectal cancer.  Patient has a history of perianal abscess in 2024 and 2025 requiring incision and drainage.  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  She reports longstanding intermittent perianal discomfort and irritation.  There is a pertinent family history of her maternal grandmother having Crohn's disease as well as rectal cancer.  Patient has not had a prior colonoscopy.  Colonoscopy is performed today to evaluate for endoscopic changes suggestive of Crohn's disease.   Past Medical History:  Diagnosis Date   Allergy    Anemia    with pregnancy   Hyperlipidemia    Perianal fistula     Past Surgical History:  Procedure Laterality Date   INCISION AND DRAINAGE ABSCESS N/A 12/11/2023   Procedure: INCISION AND DRAINAGE, ABSCESS;  Surgeon: Dasie Leonor CROME, MD;  Location: [facility] OR;  Service: General;  Laterality: N/A;   INCISION AND DRAINAGE PERIRECTAL ABSCESS N/A 11/01/2022   Procedure: IRRIGATION AND DEBRIDEMENT PERIRECTAL ABSCESS;  Surgeon: Dasie Leonor CROME, MD;  Location: [facility] OR;  Service: General;  Laterality: N/A;    Prior to Admission medications  Medication Sig Start Date End Date Taking? Authorizing Provider  amoxicillin -clavulanate (AUGMENTIN ) 875-125 MG tablet Take 1 tablet by mouth 2 (two) times daily. 03/08/24   Suzann Inocente HERO, MD  MAGNESIUM GLUCONATE PO Take by mouth daily.    [provider]  Multiple Vitamins-Minerals (WOMENS MULTIVITAMIN PO) Take by mouth  daily.    [provider]  Multiple Vitamins-Minerals (ZINC PO) Take by mouth. One daily    [provider]  Probiotic Product (PROBIOTIC DAILY PO) Take by mouth daily.    [provider]  Vitamin D -Vitamin K (VITAMIN K2-VITAMIN D3 PO) Take by mouth daily.    [provider]    Current Outpatient Medications  Medication Sig Dispense Refill   amoxicillin -clavulanate (AUGMENTIN ) 875-125 MG tablet Take 1 tablet by mouth 2 (two) times daily. 20 tablet 0   MAGNESIUM GLUCONATE PO Take by mouth daily.     Multiple Vitamins-Minerals (WOMENS MULTIVITAMIN PO) Take by mouth daily.     Multiple Vitamins-Minerals (ZINC PO) Take by mouth. One daily     Probiotic Product (PROBIOTIC DAILY PO) Take by mouth daily.     Vitamin D -Vitamin K (VITAMIN K2-VITAMIN D3 PO) Take by mouth daily.     No current facility-administered medications for this visit.    Allergies as of 04/12/2024 - Review Complete 03/08/2024  Allergen Reaction Noted   Rocephin  [ceftriaxone ] Shortness Of Breath 11/01/2022    Family History  Problem Relation Age of Onset   Miscarriages / Stillbirths Mother    Hypertension Mother    Hyperlipidemia Mother    Hearing loss Father    Alcohol abuse Father    Rectal cancer Paternal Grandmother    Crohn's disease Paternal Grandmother    Colon cancer Neg Hx    Esophageal cancer Neg Hx     Social History   Socioeconomic History   Marital status: Married  Spouse name: Dempsey   Number of children: 3   Years of education: Not on file   Highest education level: Not on file  Occupational History   Not on file  Tobacco Use   Smoking status: Never   Smokeless tobacco: Never  Vaping Use   Vaping status: Never Used  Substance and Sexual Activity   Alcohol use: No   Drug use: No   Sexual activity: Yes    Birth control/protection: None  Other Topics Concern   Not on file  Social History Narrative   Not on file   Social Drivers of Health    Tobacco Use: Low Risk  (03/08/2024)   Received from [facility]   Patient History    Smoking Tobacco Use: Never    Smokeless Tobacco Use: Never    Passive Exposure: Not on file  Financial Resource Strain: Not on file  Food Insecurity: No Food Insecurity (12/11/2023)   Epic    Worried About Programme Researcher, Broadcasting/film/video in the Last Year: Never true    Ran Out of Food in the Last Year: Never true  Transportation Needs: No Transportation Needs (12/11/2023)   Epic    Lack of Transportation (Medical): No    Lack of Transportation (Non-Medical): No  Physical Activity: Not on file  Stress: Not on file  Social Connections: Not on file  Intimate Partner Violence: Not At Risk (12/11/2023)   Epic    Fear of Current or Ex-Partner: No    Emotionally Abused: No    Physically Abused: No    Sexually Abused: No  Depression (PHQ2-9): Low Risk (11/26/2023)   Depression (PHQ2-9)    PHQ-2 Score: 1  Alcohol Screen: Not on file  Housing: Unknown (12/11/2023)   Epic    Unable to Pay for Housing in the Last Year: No    Number of Times Moved in the Last Year: Not on file    Homeless in the Last Year: No  Utilities: Not At Risk (12/11/2023)   Epic    Threatened with loss of utilities: No  Health Literacy: Not on file    Review of Systems:  All other review of systems negative except as mentioned in the HPI.  Physical Exam: Vital signs There were no vitals taken for this visit.  General:   Alert,  Well-developed, well-nourished, pleasant and cooperative in NAD Airway:  Mallampati  Lungs:  Clear throughout to auscultation.   Heart:  Regular rate and rhythm; no murmurs, clicks, rubs,  or gallops. Abdomen:  Soft, nontender and nondistended. Normal bowel sounds.   Neuro/Psych:  Normal mood and affect. A and O x 3  Inocente Hausen, MD [facility]

## 2024-04-12 ENCOUNTER — Ambulatory Visit: Admitting: Pediatrics

## 2024-04-12 ENCOUNTER — Encounter: Payer: Self-pay | Admitting: Pediatrics

## 2024-04-12 VITALS — BP 116/67 | HR 81 | Temp 97.7°F | Resp 19

## 2024-04-12 DIAGNOSIS — K644 Residual hemorrhoidal skin tags: Secondary | ICD-10-CM

## 2024-04-12 DIAGNOSIS — K635 Polyp of colon: Secondary | ICD-10-CM

## 2024-04-12 DIAGNOSIS — K621 Rectal polyp: Secondary | ICD-10-CM

## 2024-04-12 DIAGNOSIS — Z800 Family history of malignant neoplasm of digestive organs: Secondary | ICD-10-CM | POA: Diagnosis not present

## 2024-04-12 DIAGNOSIS — K648 Other hemorrhoids: Secondary | ICD-10-CM | POA: Diagnosis not present

## 2024-04-12 DIAGNOSIS — K610 Anal abscess: Secondary | ICD-10-CM | POA: Diagnosis not present

## 2024-04-12 DIAGNOSIS — Z8379 Family history of other diseases of the digestive system: Secondary | ICD-10-CM | POA: Diagnosis not present

## 2024-04-12 DIAGNOSIS — K603 Anal fistula, unspecified: Secondary | ICD-10-CM

## 2024-04-12 DIAGNOSIS — D123 Benign neoplasm of transverse colon: Secondary | ICD-10-CM

## 2024-04-12 DIAGNOSIS — D128 Benign neoplasm of rectum: Secondary | ICD-10-CM

## 2024-04-12 DIAGNOSIS — K639 Disease of intestine, unspecified: Secondary | ICD-10-CM | POA: Diagnosis not present

## 2024-04-12 MED ORDER — SODIUM CHLORIDE 0.9 % IV SOLN: 500.0000 mL | Freq: Once | INTRAVENOUS | Status: DC

## 2024-04-12 NOTE — Progress Notes (Signed)
 Pt's states no medical or surgical changes since previsit or office visit.

## 2024-04-12 NOTE — Progress Notes (Signed)
 Called to room to assist during endoscopic procedure.  Patient ID and intended procedure confirmed with present staff. Received instructions for my participation in the procedure from the performing physician.

## 2024-04-12 NOTE — Progress Notes (Signed)
 Report to PACU, RN, vss, BBS= Clear.

## 2024-04-12 NOTE — Patient Instructions (Signed)
 Resume previous diet and medications. Awaiting pathology results. Handouts provided on colon polyps and Hemorrhoids.  YOU HAD AN ENDOSCOPIC PROCEDURE TODAY AT THE [facility]:   Refer to the procedure report that was given to you for any specific questions about what was found during the examination.  If the procedure report does not answer your questions, please call your gastroenterologist to clarify.  If you requested that your care partner not be given the details of your procedure findings, then the procedure report has been included in a sealed envelope for you to review at your convenience later.  YOU SHOULD EXPECT: Some feelings of bloating in the abdomen. Passage of more gas than usual.  Walking can help get rid of the air that was put into your GI tract during the procedure and reduce the bloating. If you had a lower endoscopy (such as a colonoscopy or flexible sigmoidoscopy) you may notice spotting of blood in your stool or on the toilet paper. If you underwent a bowel prep for your procedure, you may not have a normal bowel movement for a few days.  Please Note:  You might notice some irritation and congestion in your nose or some drainage.  This is from the oxygen used during your procedure.  There is no need for concern and it should clear up in a day or so.  SYMPTOMS TO REPORT IMMEDIATELY:  Following lower endoscopy (colonoscopy or flexible sigmoidoscopy):  Excessive amounts of blood in the stool  Significant tenderness or worsening of abdominal pains  Swelling of the abdomen that is new, acute  Fever of 100F or higher   For urgent or emergent issues, a gastroenterologist can be reached at any hour by calling [phone]. Do not use MyChart messaging for urgent concerns.    DIET:  We do recommend a small meal at first, but then you may proceed to your regular diet.  Drink plenty of fluids but you should avoid alcoholic beverages for 24 hours.  ACTIVITY:  You  should plan to take it easy for the rest of today and you should NOT DRIVE or use heavy machinery until tomorrow (because of the sedation medicines used during the test).    FOLLOW UP: Our staff will call the number listed on your records the next business day following your procedure.  We will call around 7:15- 8:00 am to check on you and address any questions or concerns that you may have regarding the information given to you following your procedure. If we do not reach you, we will leave a message.     If any biopsies were taken you will be contacted by phone or by letter within the next 1-3 weeks.  Please call us at [phone] if you have not heard about the biopsies in 3 weeks.    SIGNATURES/CONFIDENTIALITY: You and/or your care partner have signed paperwork which will be entered into your electronic medical record.  These signatures attest to the fact that that the information above on your After Visit Summary has been reviewed and is understood.  Full responsibility of the confidentiality of this discharge information lies with you and/or your care-partner.

## 2024-04-12 NOTE — Op Note (Signed)
 [facility] Patient Name: Monique [name] Procedure Date: 04/12/2024 2:17 PM MRN: [id number] Endoscopist: Inocente Hausen , MD, [id number] Age: 39 Referring MD:  Date of Birth: May 06, 1985 Gender: Female Account #: [id number] Procedure:                Colonoscopy Indications:              Fistula in ano, Recurrent perianal abscess, Concern                            for Crohn's disease, Grandmother with history of                            Crohn's disease and rectal cancer Medicines:                Monitored Anesthesia Care Procedure:                Pre-Anesthesia Assessment:                           - Prior to the procedure, a History and Physical                            was performed, and patient medications and                            allergies were reviewed. The patient's tolerance of                            previous anesthesia was also reviewed. The risks                            and benefits of the procedure and the sedation                            options and risks were discussed with the patient.                            All questions were answered, and informed consent                            was obtained. Prior Anticoagulants: The patient has                            taken no anticoagulant or antiplatelet agents. ASA                            Grade Assessment: I - A normal, healthy patient.                            After reviewing the risks and benefits, the patient                            was deemed in satisfactory condition to undergo the  procedure.                           After obtaining informed consent, the colonoscope                            was passed under direct vision. Throughout the                            procedure, the patient's blood pressure, pulse, and                            oxygen saturations were monitored continuously. The                            Olympus CF-HQ190L ([id number])  Colonoscope was                            introduced through the anus and advanced to the                            terminal ileum. The colonoscopy was performed                            without difficulty. The patient tolerated the                            procedure well. The quality of the bowel                            preparation was good. The terminal ileum, ileocecal                            valve, appendiceal orifice, and rectum were                            photographed. Scope In: 2:46:50 PM Scope Out: 3:05:54 PM Scope Withdrawal Time: 0 hours 15 minutes 37 seconds  Total Procedure Duration: 0 hours 19 minutes 4 seconds  Findings:                 Skin tags were found on perianal exam.                           The digital rectal exam was normal. Pertinent                            negatives include normal sphincter tone and no                            palpable rectal lesions.                           Normal mucosa was found in the entire colon.  Biopsies were taken with a cold forceps for                            histology.                           A 5 mm polyp was found in the transverse colon. The                            polyp was sessile. The polyp was removed with a                            cold snare. Resection and retrieval were complete.                           A 3 mm polyp was found in the rectum. The polyp was                            sessile. The polyp was removed with a cold biopsy                            forceps. Resection and retrieval were complete.                           The terminal ileum could not be deeply intubated                            due to looping of the colonoscope. The visualized                            portion appeared normal. Biopsies were taken with a                            cold forceps for histology.                           Internal hemorrhoids were found during  retroflexion. Complications:            No immediate complications. Estimated blood loss:                            Minimal. Estimated Blood Loss:     Estimated blood loss was minimal. Estimated blood                            loss was minimal. Impression:               - Perianal skin tags found on perianal exam.                           - Normal mucosa in the entire examined colon.                            Biopsied.                           -  One 5 mm polyp in the transverse colon, removed                            with a cold snare. Resected and retrieved.                           - One 3 mm polyp in the rectum, removed with a cold                            biopsy forceps. Resected and retrieved.                           - The examined portion of the ileum was normal.                            Biopsied.                           - Internal hemorrhoids. Recommendation:           - Discharge patient to home (ambulatory).                           - Await pathology results.                           - The findings and recommendations were discussed                            with the patient's family.                           - Patient has a contact number available for                            emergencies. The signs and symptoms of potential                            delayed complications were discussed with the                            patient. Return to normal activities tomorrow.                            Written discharge instructions were provided to the                            patient. Inocente Hausen, MD 04/12/2024 3:14:43 PM This report has been signed electronically.

## 2024-04-13 ENCOUNTER — Telehealth: Payer: Self-pay

## 2024-04-13 NOTE — Telephone Encounter (Signed)
No answer on follow up call. 

## 2024-04-17 LAB — SURGICAL PATHOLOGY

## 2024-04-18 ENCOUNTER — Encounter: Payer: Self-pay | Admitting: Pediatrics

## 2024-04-19 ENCOUNTER — Ambulatory Visit: Payer: Self-pay | Admitting: Pediatrics

## 2024-05-29 ENCOUNTER — Ambulatory Visit: Admitting: General Practice
